# Patient Record
Sex: Male | Born: 1995 | Race: White | Hispanic: No | Marital: Single | Smoking: Never smoker
Health system: Southern US, Community
[De-identification: ages and names within clinical notes are randomized; demographics above are authoritative.]

## PROBLEM LIST (undated history)

## (undated) DIAGNOSIS — I69359 Hemiplegia and hemiparesis following cerebral infarction affecting unspecified side: Secondary | ICD-10-CM

## (undated) DIAGNOSIS — I639 Cerebral infarction, unspecified: Secondary | ICD-10-CM

## (undated) DIAGNOSIS — G709 Myoneural disorder, unspecified: Secondary | ICD-10-CM

## (undated) DIAGNOSIS — G809 Cerebral palsy, unspecified: Secondary | ICD-10-CM

## (undated) DIAGNOSIS — Q233 Congenital mitral insufficiency: Secondary | ICD-10-CM

## (undated) DIAGNOSIS — G40909 Epilepsy, unspecified, not intractable, without status epilepticus: Secondary | ICD-10-CM

## (undated) HISTORY — DX: Congenital mitral insufficiency: Q23.3

## (undated) HISTORY — DX: Hemiplegia and hemiparesis following cerebral infarction affecting unspecified side: I69.359

## (undated) HISTORY — DX: Myoneural disorder, unspecified: G70.9

## (undated) HISTORY — DX: Cerebral palsy, unspecified: G80.9

## (undated) HISTORY — DX: Epilepsy, unspecified, not intractable, without status epilepticus: G40.909

## (undated) HISTORY — DX: Cerebral infarction, unspecified: I63.9

---
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
## 2017-02-16 DIAGNOSIS — H02822 Cysts of right lower eyelid: Secondary | ICD-10-CM | POA: Insufficient documentation

## 2017-02-16 HISTORY — DX: Cysts of right lower eyelid: H02.822

## 2017-08-24 ENCOUNTER — Other Ambulatory Visit: Payer: Self-pay | Admitting: Internal Medicine

## 2017-11-19 ENCOUNTER — Other Ambulatory Visit: Payer: Self-pay | Admitting: Internal Medicine

## 2017-12-20 ENCOUNTER — Ambulatory Visit (INDEPENDENT_AMBULATORY_CARE_PROVIDER_SITE_OTHER): Payer: Medicaid Other | Admitting: Nurse Practitioner

## 2017-12-20 ENCOUNTER — Encounter: Payer: Self-pay | Admitting: Nurse Practitioner

## 2017-12-20 VITALS — BP 106/80 | HR 66 | Resp 16 | Ht 66.0 in | Wt 100.6 lb

## 2017-12-20 DIAGNOSIS — G40909 Epilepsy, unspecified, not intractable, without status epilepticus: Secondary | ICD-10-CM | POA: Diagnosis not present

## 2017-12-20 MED ORDER — CARBAMAZEPINE ER 200 MG PO CP12: 200.0000 mg | ORAL_CAPSULE | Freq: Every day | ORAL | 5 refills | Status: DC

## 2017-12-20 NOTE — Progress Notes (Signed)
[facility] [address] [place], [place] [address]  Internal MEDICINE  Office Visit Note  Patient Name: Tony Harper  [id number]  [id number]  Date of Service: 01/09/2018   Pt is here for routine follow up.   Chief Complaint  Patient presents with  . Follow-up    refill meds - carbemazepine to prevent seizures.     The patient needs to have refills for carbamazepine. Has been seizure free for the past several years. He currently has no concerns o complaints.       Current Medication: Outpatient Encounter Medications as of 12/20/2017  Medication Sig  . carbamazepine (CARBATROL) 200 MG 12 hr capsule TAKE 1 CAPSULE BY MOUTH DAILY  . carbamazepine (EQUETRO) 200 MG CP12 12 hr capsule Take 1 capsule (200 mg total) by mouth daily.   No facility-administered encounter medications on file as of 12/20/2017.     Surgical History: History reviewed. No pertinent surgical history.  Medical History: Past Medical History:  Diagnosis Date  . Cerebral palsy (HCC)   . 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 Seizure disorder [facility])     Family History: Family History  Problem Relation Age of Onset  . Hypertension Mother   . Asthma Mother   . Asthma Sister   . Hypertension Maternal Aunt   . Hypertension Maternal Grandmother   . Stroke Maternal Grandmother   . Cancer Maternal Grandfather   . Stroke Paternal Grandmother     Social History   Socioeconomic History  . Marital status: Single    Spouse name: Not on file  . Number of children: Not on file  . Years of education: Not on file  . Highest education level: Not on file  Occupational History  . Not on file  Social Needs  . Financial resource strain: Not on file  . Food insecurity:    Worry: Not on file    Inability: Not on file  . Transportation needs:    Medical: Not on file    Non-medical: Not on file  Tobacco Use  .  Smoking status: Never Smoker  . Smokeless tobacco: Never Used  Substance and Sexual Activity  . Alcohol use: Never    Frequency: Never  . Drug use: Never  . Sexual activity: Not on file  Lifestyle  . Physical activity:    Days per week: Not on file    Minutes per session: Not on file  . Stress: Not on file  Relationships  . Social connections:    Talks on phone: Not on file    Gets together: Not on file    Attends religious service: Not on file    Active member of club or organization: Not on file    Attends meetings of clubs or organizations: Not on file    Relationship status: Not on file  . Intimate partner violence:    Fear of current or ex partner: Not on file    Emotionally abused: Not on file    Physically abused: Not on file    Forced sexual activity: Not on file  Other Topics Concern  . Not on file  Social History Narrative  . Not on file      Review of Systems  Constitutional: Negative for activity change, chills, fatigue and unexpected weight change.  HENT: Negative for congestion, postnasal drip, rhinorrhea, sneezing and sore throat.   Eyes: Negative.  Negative  for redness.  Respiratory: Negative for cough, chest tightness, shortness of breath and wheezing.   Cardiovascular: Negative for chest pain and palpitations.  Gastrointestinal: Negative for abdominal pain, constipation, diarrhea, nausea and vomiting.  Endocrine: Negative for cold intolerance, heat intolerance, polydipsia, polyphagia and polyuria.  Genitourinary: Negative for dysuria, frequency, hematuria and urgency.  Musculoskeletal: Negative for arthralgias, back pain, joint swelling and neck pain.  Skin: Negative for rash.  Neurological: Positive for seizures. Negative for tremors and numbness.  Hematological: Negative for adenopathy. Does not bruise/bleed easily.  Psychiatric/Behavioral: Negative for behavioral problems (Depression), dysphoric mood, sleep disturbance and suicidal ideas. The patient  is not nervous/anxious.     Today's Vitals   12/20/17 1138  BP: 106/80  Pulse: 66  Resp: 16  SpO2: 97%  Weight: 100 lb 9.6 oz (45.6 kg)  Height:  (1.676 m)    Physical Exam  Constitutional: He is oriented to person, place, and time. He appears well-developed and well-nourished. No distress.  HENT:  Head: Normocephalic and atraumatic.  Mouth/Throat: Oropharynx is clear and moist. No oropharyngeal exudate.  Eyes: Pupils are equal, round, and reactive to light. Conjunctivae and EOM are normal.  Neck: Normal range of motion. Neck supple. No JVD present. No tracheal deviation present. No thyromegaly present.  Cardiovascular: Normal rate, regular rhythm and normal heart sounds. Exam reveals no gallop and no friction rub.  No murmur heard. Pulmonary/Chest: Effort normal and breath sounds normal. No respiratory distress. He has no wheezes. He has no rales. He exhibits no tenderness.  Abdominal: Soft. Bowel sounds are normal. There is no tenderness.  Musculoskeletal: Normal range of motion.  Lymphadenopathy:    He has no cervical adenopathy.  Neurological: He is alert and oriented to person, place, and time. He displays normal reflexes. No cranial nerve deficit.  Skin: Skin is warm and dry. He is not diaphoretic.  Psychiatric: He has a normal mood and affect. His behavior is normal. Judgment and thought content normal.  Nursing note and vitals reviewed.  Assessment/Plan:   1. Seizure disorder (HCC) Seizure free for past several years. Continue carbamazepine as prescribed. Refills provided today.  - carbamazepine (EQUETRO) 200 MG CP12 12 hr capsule; Take 1 capsule (200 mg total) by mouth daily.  Dispense: 30 each; Refill: 5  General Counseling: Tony Harper verbalizes understanding of the findings of todays visit and agrees with plan of treatment. I have discussed any further diagnostic evaluation that may be needed or ordered today. We also reviewed his medications today. he has been  encouraged to call the office with any questions or concerns that should arise related to todays visit.   This patient was seen by Vincent Gros, FNP- C in Collaboration with Dr Lyndon Code as a part of collaborative care agreement  Meds ordered this encounter  Medications  . carbamazepine (EQUETRO) 200 MG CP12 12 hr capsule    Sig: Take 1 capsule (200 mg total) by mouth daily.    Dispense:  30 each    Refill:  5    Order Specific Question:   Supervising Provider    Answer:   Lyndon Code [[id number]]    Time spent: 45 Minutes      Dr Lyndon Code Internal medicine

## 2018-01-09 ENCOUNTER — Encounter: Payer: Self-pay | Admitting: Nurse Practitioner

## 2018-01-09 DIAGNOSIS — G40909 Epilepsy, unspecified, not intractable, without status epilepticus: Secondary | ICD-10-CM | POA: Insufficient documentation

## 2018-02-16 ENCOUNTER — Other Ambulatory Visit: Payer: Self-pay

## 2018-02-16 DIAGNOSIS — G40909 Epilepsy, unspecified, not intractable, without status epilepticus: Secondary | ICD-10-CM

## 2018-02-16 MED ORDER — CARBAMAZEPINE ER 200 MG PO CP12: 200.0000 mg | ORAL_CAPSULE | Freq: Every day | ORAL | 2 refills | Status: DC

## 2018-02-21 ENCOUNTER — Other Ambulatory Visit: Payer: Self-pay | Admitting: Nurse Practitioner

## 2018-02-21 DIAGNOSIS — G40909 Epilepsy, unspecified, not intractable, without status epilepticus: Secondary | ICD-10-CM

## 2018-02-21 MED ORDER — CARBAMAZEPINE ER 200 MG PO CP12: 200.0000 mg | ORAL_CAPSULE | Freq: Every day | ORAL | 2 refills | Status: DC

## 2018-03-10 ENCOUNTER — Other Ambulatory Visit: Payer: Self-pay

## 2018-03-10 DIAGNOSIS — G40909 Epilepsy, unspecified, not intractable, without status epilepticus: Secondary | ICD-10-CM

## 2018-03-10 MED ORDER — CARBAMAZEPINE ER 200 MG PO CP12: 200.0000 mg | ORAL_CAPSULE | Freq: Every day | ORAL | 1 refills | Status: DC

## 2018-06-02 ENCOUNTER — Other Ambulatory Visit: Payer: Self-pay | Admitting: Nurse Practitioner

## 2018-06-02 DIAGNOSIS — G40909 Epilepsy, unspecified, not intractable, without status epilepticus: Secondary | ICD-10-CM

## 2018-06-02 MED ORDER — CARBAMAZEPINE ER 200 MG PO CP12: 200.0000 mg | ORAL_CAPSULE | Freq: Every day | ORAL | 0 refills | Status: DC

## 2018-06-02 NOTE — Progress Notes (Signed)
Refilled carbamazepine 200mg  QD per pharmacy request. Patient has follow up appointment 10/31

## 2018-06-03 ENCOUNTER — Other Ambulatory Visit: Payer: Self-pay

## 2018-06-08 ENCOUNTER — Other Ambulatory Visit: Payer: Self-pay | Admitting: Nurse Practitioner

## 2018-06-08 DIAGNOSIS — G40909 Epilepsy, unspecified, not intractable, without status epilepticus: Secondary | ICD-10-CM

## 2018-06-08 MED ORDER — CARBAMAZEPINE ER 200 MG PO CP12: 200.0000 mg | ORAL_CAPSULE | Freq: Every day | ORAL | 0 refills | Status: DC

## 2018-06-09 ENCOUNTER — Other Ambulatory Visit: Payer: Self-pay

## 2018-06-09 DIAGNOSIS — G40909 Epilepsy, unspecified, not intractable, without status epilepticus: Secondary | ICD-10-CM

## 2018-06-09 MED ORDER — CARBAMAZEPINE ER 200 MG PO CP12: 200.0000 mg | ORAL_CAPSULE | Freq: Every day | ORAL | 1 refills | Status: DC

## 2018-06-09 NOTE — Telephone Encounter (Signed)
Spoke with pharmacist to ensure the rx for pt went through electronically. Pharmacist stated that it had been rejected yesterday, I gave verbal approval for refill per heather.

## 2018-06-23 ENCOUNTER — Encounter: Payer: Self-pay | Admitting: Nurse Practitioner

## 2018-06-23 ENCOUNTER — Ambulatory Visit: Payer: Medicaid Other | Admitting: Nurse Practitioner

## 2018-06-23 VITALS — BP 122/86 | HR 68 | Resp 16 | Ht 66.0 in | Wt 102.4 lb

## 2018-06-23 DIAGNOSIS — G809 Cerebral palsy, unspecified: Secondary | ICD-10-CM | POA: Diagnosis not present

## 2018-06-23 DIAGNOSIS — Q233 Congenital mitral insufficiency: Secondary | ICD-10-CM | POA: Insufficient documentation

## 2018-06-23 DIAGNOSIS — G40909 Epilepsy, unspecified, not intractable, without status epilepticus: Secondary | ICD-10-CM | POA: Diagnosis not present

## 2018-06-23 NOTE — Progress Notes (Signed)
[facility] [address] [place], [place] [address]  Internal MEDICINE  Office Visit Note  Patient Name: Tony Harper  [id number]  [id number]  Date of Service: 06/23/2018  Chief Complaint  Patient presents with  . Medical Management of Chronic Issues    6 month follow up    The patient does have chronic seizure disorder. Takes Carbamazepine for this. Has been seizure free for the past several years. He needs to have refills for this today. He currently has no concerns o complaints.       Current Medication: Outpatient Encounter Medications as of 06/23/2018  Medication Sig  . carbamazepine (EQUETRO) 200 MG CP12 12 hr capsule Take 1 capsule (200 mg total) by mouth daily.   No facility-administered encounter medications on file as of 06/23/2018.     Surgical History: History reviewed. No pertinent surgical history.  Medical History: Past Medical History:  Diagnosis Date  . Cerebral palsy (HCC)   . 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 Seizure disorder [facility])     Family History: Family History  Problem Relation Age of Onset  . Hypertension Mother   . Asthma Mother   . Asthma Sister   . Hypertension Maternal Aunt   . Hypertension Maternal Grandmother   . Stroke Maternal Grandmother   . Cancer Maternal Grandfather   . Stroke Paternal Grandmother     Social History   Socioeconomic History  . Marital status: Single    Spouse name: Not on file  . Number of children: Not on file  . Years of education: Not on file  . Highest education level: Not on file  Occupational History  . Not on file  Social Needs  . Financial resource strain: Not on file  . Food insecurity:    Worry: Not on file    Inability: Not on file  . Transportation needs:    Medical: Not on file    Non-medical: Not on file  Tobacco Use  . Smoking status: Never Smoker  . Smokeless tobacco: Never  Used  Substance and Sexual Activity  . Alcohol use: Never    Frequency: Never  . Drug use: Never  . Sexual activity: Not on file  Lifestyle  . Physical activity:    Days per week: Not on file    Minutes per session: Not on file  . Stress: Not on file  Relationships  . Social connections:    Talks on phone: Not on file    Gets together: Not on file    Attends religious service: Not on file    Active member of club or organization: Not on file    Attends meetings of clubs or organizations: Not on file    Relationship status: Not on file  . Intimate partner violence:    Fear of current or ex partner: Not on file    Emotionally abused: Not on file    Physically abused: Not on file    Forced sexual activity: Not on file  Other Topics Concern  . Not on file  Social History Narrative  . Not on file      Review of Systems  Constitutional: Negative for activity change, chills, fatigue and unexpected weight change.  HENT: Negative for congestion, postnasal drip, rhinorrhea, sneezing and sore throat.   Eyes: Negative.  Negative for redness.  Respiratory: Negative for cough, chest tightness, shortness of breath and wheezing.  Cardiovascular: Negative for chest pain and palpitations.  Gastrointestinal: Negative for abdominal pain, constipation, diarrhea, nausea and vomiting.  Endocrine: Negative for cold intolerance, heat intolerance, polydipsia, polyphagia and polyuria.  Allergic/Immunologic: Negative for environmental allergies.  Neurological: Positive for seizures. Negative for tremors and numbness.  Hematological: Negative for adenopathy. Does not bruise/bleed easily.  Psychiatric/Behavioral: Negative for behavioral problems (Depression), dysphoric mood, sleep disturbance and suicidal ideas. The patient is not nervous/anxious.     Vital Signs: BP 122/86 (BP Location: Right Arm, Patient Position: Sitting, Cuff Size: Normal)   Pulse 68   Resp 16   Ht 5\' 6"  (1.676 m)   Wt 102  lb 6.4 oz (46.4 kg)   SpO2 99%   BMI 16.53 kg/m    Physical Exam  Constitutional: He is oriented to person, place, and time. He appears well-developed and well-nourished. No distress.  HENT:  Head: Normocephalic and atraumatic.  Mouth/Throat: Oropharynx is clear and moist. No oropharyngeal exudate.  Eyes: Pupils are equal, round, and reactive to light. Conjunctivae and EOM are normal.  Neck: Normal range of motion. Neck supple. No JVD present. No tracheal deviation present. No thyromegaly present.  Cardiovascular: Normal rate, regular rhythm and normal heart sounds. Exam reveals no gallop and no friction rub.  No murmur heard. Pulmonary/Chest: Effort normal and breath sounds normal. No respiratory distress. He has no wheezes. He has no rales. He exhibits no tenderness.  Abdominal: Soft. Bowel sounds are normal. There is no tenderness.  Lymphadenopathy:    He has no cervical adenopathy.  Neurological: He is alert and oriented to person, place, and time. He displays normal reflexes. No cranial nerve deficit.  The patient is at neurological baseline  Skin: Skin is warm and dry. He is not diaphoretic.  Psychiatric: He has a normal mood and affect. His behavior is normal. Judgment and thought content normal.  Nursing note and vitals reviewed.  Assessment/Plan:  1. Seizure disorder (HCC) Continue carbamazepine as prescribed. Refills to be provided as needed   2. Cerebral palsy, unspecified type (HCC) Stable. Continue to monitor.    General Counseling: angelus hoopes understanding of the findings of todays visit and agrees with plan of treatment. I have discussed any further diagnostic evaluation that may be needed or ordered today. We also reviewed his medications today. he has been encouraged to call the office with any questions or concerns that should arise related to todays visit.  This patient was seen by Vincent Gros FNP Collaboration with Dr Lyndon Code as a part of  collaborative care agreement  Time spent: 15 Minutes      Dr Lyndon Code Internal medicine

## 2018-07-22 ENCOUNTER — Emergency Department: Payer: Medicaid Other

## 2018-07-22 ENCOUNTER — Other Ambulatory Visit: Payer: Self-pay

## 2018-07-22 ENCOUNTER — Inpatient Hospital Stay
Admission: EM | Admit: 2018-07-22 | Discharge: 2018-07-24 | DRG: 871 | Disposition: A | Discharge status: Home / Self Care | Payer: Medicaid Other | Attending: Internal Medicine | Admitting: Internal Medicine

## 2018-07-22 ENCOUNTER — Encounter: Payer: Self-pay | Admitting: Emergency Medicine

## 2018-07-22 DIAGNOSIS — Z825 Family history of asthma and other chronic lower respiratory diseases: Secondary | ICD-10-CM

## 2018-07-22 DIAGNOSIS — Z8249 Family history of ischemic heart disease and other diseases of the circulatory system: Secondary | ICD-10-CM

## 2018-07-22 DIAGNOSIS — J111 Influenza due to unidentified influenza virus with other respiratory manifestations: Secondary | ICD-10-CM

## 2018-07-22 DIAGNOSIS — Z820 Family history of epilepsy and other diseases of the nervous system: Secondary | ICD-10-CM

## 2018-07-22 DIAGNOSIS — I69351 Hemiplegia and hemiparesis following cerebral infarction affecting right dominant side: Secondary | ICD-10-CM

## 2018-07-22 DIAGNOSIS — I959 Hypotension, unspecified: Secondary | ICD-10-CM

## 2018-07-22 DIAGNOSIS — A419 Sepsis, unspecified organism: Principal | ICD-10-CM

## 2018-07-22 DIAGNOSIS — R569 Unspecified convulsions: Secondary | ICD-10-CM

## 2018-07-22 DIAGNOSIS — G9341 Metabolic encephalopathy: Secondary | ICD-10-CM | POA: Diagnosis present

## 2018-07-22 DIAGNOSIS — Z823 Family history of stroke: Secondary | ICD-10-CM

## 2018-07-22 DIAGNOSIS — Z809 Family history of malignant neoplasm, unspecified: Secondary | ICD-10-CM

## 2018-07-22 DIAGNOSIS — J690 Pneumonitis due to inhalation of food and vomit: Secondary | ICD-10-CM | POA: Diagnosis present

## 2018-07-22 DIAGNOSIS — I340 Nonrheumatic mitral (valve) insufficiency: Secondary | ICD-10-CM | POA: Diagnosis present

## 2018-07-22 DIAGNOSIS — Z79899 Other long term (current) drug therapy: Secondary | ICD-10-CM

## 2018-07-22 DIAGNOSIS — G9389 Other specified disorders of brain: Secondary | ICD-10-CM | POA: Diagnosis present

## 2018-07-22 DIAGNOSIS — G40909 Epilepsy, unspecified, not intractable, without status epilepticus: Secondary | ICD-10-CM | POA: Diagnosis present

## 2018-07-22 DIAGNOSIS — R1011 Right upper quadrant pain: Secondary | ICD-10-CM

## 2018-07-22 DIAGNOSIS — J45998 Other asthma: Secondary | ICD-10-CM | POA: Diagnosis present

## 2018-07-22 DIAGNOSIS — K828 Other specified diseases of gallbladder: Secondary | ICD-10-CM | POA: Diagnosis present

## 2018-07-22 DIAGNOSIS — J101 Influenza due to other identified influenza virus with other respiratory manifestations: Secondary | ICD-10-CM | POA: Diagnosis present

## 2018-07-22 DIAGNOSIS — R4182 Altered mental status, unspecified: Secondary | ICD-10-CM

## 2018-07-22 DIAGNOSIS — G809 Cerebral palsy, unspecified: Secondary | ICD-10-CM | POA: Diagnosis present

## 2018-07-22 DIAGNOSIS — E860 Dehydration: Secondary | ICD-10-CM | POA: Diagnosis present

## 2018-07-22 LAB — COMPREHENSIVE METABOLIC PANEL
ALT: 12 U/L (ref 0–44)
ANION GAP: 12 (ref 5–15)
AST: 28 U/L (ref 15–41)
Albumin: 4.2 g/dL (ref 3.5–5.0)
Alkaline Phosphatase: 113 U/L (ref 38–126)
BUN: 12 mg/dL (ref 6–20)
CO2: 19 mmol/L — ABNORMAL LOW (ref 22–32)
Calcium: 8.4 mg/dL — ABNORMAL LOW (ref 8.9–10.3)
Chloride: 105 mmol/L (ref 98–111)
Creatinine, Ser: 1.02 mg/dL (ref 0.61–1.24)
GFR calc Af Amer: >60 mL/min (ref >60)
GFR calc non Af Amer: >60 mL/min (ref >60)
GLUCOSE: 146 mg/dL — AB (ref 70–99)
Potassium: 3.6 mmol/L (ref 3.5–5.1)
Sodium: 136 mmol/L (ref 135–145)
Total Bilirubin: 0.6 mg/dL (ref 0.3–1.2)
Total Protein: 6.8 g/dL (ref 6.5–8.1)

## 2018-07-22 LAB — CBC WITH DIFFERENTIAL/PLATELET
ABS IMMATURE GRANULOCYTES: 0.02 10*3/uL (ref 0.00–0.07)
Basophils Absolute: 0.1 10*3/uL (ref 0.0–0.1)
Basophils Relative: 1 %
Eosinophils Absolute: 0 10*3/uL (ref 0.0–0.5)
Eosinophils Relative: 0 %
HCT: 43.8 % (ref 39.0–52.0)
Hemoglobin: 15.2 g/dL (ref 13.0–17.0)
Immature Granulocytes: 0 %
Lymphocytes Relative: 8 %
Lymphs Abs: 0.5 10*3/uL — ABNORMAL LOW (ref 0.7–4.0)
MCH: 29.7 pg (ref 26.0–34.0)
MCHC: 34.7 g/dL (ref 30.0–36.0)
MCV: 85.7 fL (ref 80.0–100.0)
MONO ABS: 0.6 10*3/uL (ref 0.1–1.0)
MONOS PCT: 10 %
NEUTROS ABS: 4.8 10*3/uL (ref 1.7–7.7)
NEUTROS PCT: 81 %
Platelets: 149 10*3/uL — ABNORMAL LOW (ref 150–400)
RBC: 5.11 MIL/uL (ref 4.22–5.81)
RDW: 11.7 % (ref 11.5–15.5)
WBC: 6 10*3/uL (ref 4.0–10.5)
nRBC: 0 % (ref 0.0–0.2)

## 2018-07-22 LAB — INFLUENZA PANEL BY PCR (TYPE A & B)
Influenza A By PCR: POSITIVE — AB
Influenza B By PCR: NEGATIVE

## 2018-07-22 LAB — CARBAMAZEPINE LEVEL, TOTAL: Carbamazepine Lvl: 2.4 ug/mL — ABNORMAL LOW (ref 4.0–12.0)

## 2018-07-22 LAB — PHENYTOIN LEVEL, TOTAL: Phenytoin Lvl: <2.5 ug/mL — ABNORMAL LOW (ref 10.0–20.0)

## 2018-07-22 MED ORDER — LORAZEPAM 2 MG/ML IJ SOLN
1.0000 mg | Freq: Once | INTRAMUSCULAR | Status: AC
  Administered 2018-07-22: 1 mg via INTRAVENOUS

## 2018-07-22 MED ORDER — FLUMAZENIL 0.5 MG/5ML IV SOLN
INTRAVENOUS | Status: AC
  Filled 2018-07-22: qty 5

## 2018-07-22 MED ORDER — IOPAMIDOL (ISOVUE-300) INJECTION 61%
100.0000 mL | Freq: Once | INTRAVENOUS | Status: AC | PRN
  Administered 2018-07-22: 100 mL via INTRAVENOUS

## 2018-07-22 MED ORDER — LORAZEPAM 2 MG/ML IJ SOLN
INTRAMUSCULAR | Status: AC
  Filled 2018-07-22: qty 1

## 2018-07-22 MED ORDER — LORAZEPAM 2 MG/ML IJ SOLN
INTRAMUSCULAR | Status: AC
  Administered 2018-07-22: 2 mg
  Filled 2018-07-22: qty 1

## 2018-07-22 MED ORDER — SODIUM CHLORIDE 0.9 % IV SOLN
1.0000 g | Freq: Once | INTRAVENOUS | Status: AC
  Administered 2018-07-22: 1 g via INTRAVENOUS
  Filled 2018-07-22: qty 1

## 2018-07-22 NOTE — ED Notes (Signed)
Pt yellow ring with yellow stone that says [employer] and black watch given to patient's mother.

## 2018-07-22 NOTE — ED Provider Notes (Addendum)
[facility] Emergency Department Provider Note   ____________________________________________   First MD Initiated Contact with Patient 07/22/18 2209     (approximate)  I have reviewed the triage vital signs and the nursing notes.   HISTORY  Chief Complaint Seizures  History obtained from mom as patient is postictal  HPI Tony Harper is a 22 y.o. male patient has mild CP and mild MR had a stroke when he was very young but is normally walkie-talkie and normal.  He had a seizure disorder last seizure was 5 years ago.  He woke up this morning complaining of pain on the right side of his body just at the end of the ribs.  It was possibly worse with deep breathing.  Pain was moderate.  Afternoon he suddenly spiked a fever to 103 and then this evening had a seizure.  It was again his first for 5 years.  EMS arrived and he was still seizing they gave him 2 of Versed and the seizure stopped.  On arrival here patient was confused and combative and made it difficult to maintain his IV.  He was given some doses of Ativan and became more compliant.  We are able to CT him.  Patient does have a cough in the emergency room he did not have a cough during the day.  His carbamazepine dose has not been adjusted for many years.   Past Medical History:  Diagnosis Date  . Cerebral palsy (HCC)   . 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 Seizure disorder [facility])     Patient Active Problem List   Diagnosis Date Noted  . 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 Cerebral palsy (HCC) 06/23/2018  . Seizure disorder (HCC) 01/09/2018    History reviewed. No pertinent surgical history.  Prior to Admission medications   Medication Sig Start Date End Date Taking? Authorizing Provider  carbamazepine (CARBATROL) 200 MG 12 hr capsule Take 200 mg by mouth at bedtime. 06/11/18  Yes [provider]  carbamazepine (EQUETRO) 200 MG CP12 12 hr capsule Take 1 capsule (200 mg total) by mouth daily. Patient not taking: Reported on 07/23/2018 06/09/18   Carlean Jews, NP    Allergies Patient has no known allergies.  Family History  Problem Relation Age of Onset  . Hypertension Mother   . Asthma Mother   . Asthma Sister   . Hypertension Maternal Aunt   . Hypertension Maternal Grandmother   . Stroke Maternal Grandmother   . Cancer Maternal Grandfather   . Stroke Paternal Grandmother     Social History Social History   Tobacco Use  . Smoking status: Never Smoker  . Smokeless tobacco: Never Used  Substance Use Topics  . Alcohol use: Never    Frequency: Never  . Drug use: Never    Review of Systems Unable to obtain  ____________________________________________   PHYSICAL EXAM:  VITAL SIGNS: ED Triage Vitals  Enc Vitals Group     BP --      Pulse --      Resp --      Temp 07/22/18 2213 (!) 101.2 F (38.4 C)     Temp Source 07/22/18 2213 Rectal     SpO2 07/22/18 2211 99 %     Weight 07/22/18 2214 102 lb 4.7 oz (46.4 kg)     Height 07/22/18 2214 5\' 10"  (1.778 m)  Head Circumference --      Peak Flow --      Pain Score --      Pain Loc --      Pain Edu? --      Excl. in GC? --    Constitutional: Confused and combative due to postictal state Eyes: Conjunctivae are normal. PER. EOMI. Head: Atraumatic. Nose: No congestion/rhinnorhea. Mouth/Throat: Mucous membranes are moist.  Oropharynx non-erythematous. Neck: No stridor.  Cardiovascular: Rapid rate, regular rhythm. Grossly normal heart sounds.  Good peripheral circulation. Respiratory: Normal respiratory effort.  No retractions. Lungs CTAB. Gastrointestinal: Soft and nontender. No distention. No abdominal bruits. No CVA tenderness. Musculoskeletal: No lower extremity tenderness nor edema.  No joint effusions. Neurologic:  Normal speech and language. No gross focal neurologic deficits are  appreciated. No gait instability. Skin:  Skin is warm, dry and intact. No rash noted. Psychiatric: Mood and affect are normal. Speech and behavior are normal.  ____________________________________________   LABS (all labs ordered are listed, but only abnormal results are displayed)  Labs Reviewed  COMPREHENSIVE METABOLIC PANEL - Abnormal; Notable for the following components:      Result Value   CO2 19 (*)    Glucose, Bld 146 (*)    Calcium 8.4 (*)    All other components within normal limits  CBC WITH DIFFERENTIAL/PLATELET - Abnormal; Notable for the following components:   Platelets 149 (*)    Lymphs Abs 0.5 (*)    All other components within normal limits  PHENYTOIN LEVEL, TOTAL - Abnormal; Notable for the following components:   Phenytoin Lvl <2.5 (*)    All other components within normal limits  CARBAMAZEPINE LEVEL, TOTAL - Abnormal; Notable for the following components:   Carbamazepine Lvl 2.4 (*)    All other components within normal limits  INFLUENZA PANEL BY PCR (TYPE A & B) - Abnormal; Notable for the following components:   Influenza A By PCR POSITIVE (*)    All other components within normal limits  LACTIC ACID, PLASMA - Abnormal; Notable for the following components:   Lactic Acid, Venous 4.3 (*)    All other components within normal limits  CULTURE, BLOOD (ROUTINE X 2)  CULTURE, BLOOD (ROUTINE X 2)  URINALYSIS, ROUTINE W REFLEX MICROSCOPIC  LACTIC ACID, PLASMA  CSF CELL COUNT WITH DIFFERENTIAL  GLUCOSE, CSF  PROTEIN, CSF   ____________________________________________  EKG  EKG read and interpreted by me shows sinus tachycardia rate of 122 normal axis no acute ST-T wave changes ____________________________________________  RADIOLOGY  ED MD interpretation: CT of the head shows no acute changes I reviewed the films CT of the chest shows some tree-in-bud changes in the right lower lobe I reviewed those films as well CT of the belly shows a somewhat enlarged  gallbladder I reviewed those films as well  Official radiology report(s): Ct Head Wo Contrast  Result Date: 07/22/2018 CLINICAL DATA:  First seizure in 5 years. Altered level of consciousness, unexplained. Fever. EXAM: CT HEAD WITHOUT CONTRAST TECHNIQUE: Contiguous axial images were obtained from the base of the skull through the vertex without intravenous contrast. COMPARISON:  None. FINDINGS: Brain: No acute hemorrhage. Left parietal encephalomalacia with associated ex vacuo dilatation of the left lateral ventricle. No evidence of acute ischemia. No midline shift or mass effect. No hydrocephalus. The basilar cisterns are patent. No subdural collection. Vascular: No hyperdense vessel or unexpected calcification. Skull: No fracture or focal lesion. Sinuses/Orbits: Paranasal sinuses and mastoid air cells are clear. Small mucous retention cyst  in the right maxillary sinus. The visualized orbits are unremarkable. Other: None. IMPRESSION: 1. No acute intracranial abnormality. 2. Remote left parietal infarct with mild ex vacuo dilatation of the left lateral ventricle. Electronically Signed   By: Narda Rutherford M.D.   On: 07/22/2018 23:15   Ct Chest W Contrast  Result Date: 07/22/2018 CLINICAL DATA:  Right upper quadrant pain. Sudden onset of fever. Seizure. EXAM: CT CHEST, ABDOMEN, AND PELVIS WITH CONTRAST TECHNIQUE: Multidetector CT imaging of the chest, abdomen and pelvis was performed following the standard protocol during bolus administration of intravenous contrast. CONTRAST:  ISOVUE-300 IOPAMIDOL (ISOVUE-300) INJECTION 61% COMPARISON:  None. FINDINGS: CT CHEST FINDINGS Cardiovascular: No significant vascular findings. Normal heart size. No pericardial effusion. Mediastinum/Nodes: Triangular soft tissue density in the anterior mediastinum consistent with residual or recurrent thymus. Upper esophagus mildly patulous without wall thickening. No adenopathy. Lungs/Pleura: Tree in bud opacities in the  right lower lobe with right lower lobe bronchial thickening. No confluent airspace disease. The left lung is clear. Mild motion artifact through the basis. No pulmonary edema or pleural fluid. Musculoskeletal: There are no acute or suspicious osseous abnormalities. CT ABDOMEN PELVIS FINDINGS Hepatobiliary: Motion artifact through the upper abdomen. Mild gallbladder distention without calcified gallstone. No biliary dilatation. No focal hepatic abnormality. Pancreas: Unremarkable allowing for motion artifact. Spleen: Normal in size without focal abnormality. Adrenals/Urinary Tract: Adrenal glands are unremarkable. Kidneys are normal, without renal calculi, focal lesion, or hydronephrosis. Bladder is nondistended but otherwise negative. Stomach/Bowel: Bowel evaluation is limited in the absence of enteric contrast, paucity of intra-abdominal fat, and motion artifact. No evidence of obstruction or bowel inflammation. Normal appendix tentatively identified, for example image 48 series 5. Regardless, no pericecal inflammation to suggest appendicitis. No colonic wall thickening or inflammatory change. Vascular/Lymphatic: No significant vascular findings are present. No enlarged abdominal or pelvic lymph nodes. Reproductive: Prostate is unremarkable. Other: No free air or free fluid. Musculoskeletal: Mild scoliotic curvature of the lower lumbar spine. There are no acute or suspicious osseous abnormalities. IMPRESSION: 1. Tree in bud opacities in the right lower lobe with right lower lobe bronchial thickening. Findings may represent infectious bronchiolitis or aspiration. 2. Motion artifact through the gallbladder with mild gallbladder distention. If there is clinical concern for hepatobiliary pathology, recommend sonographic evaluation. Electronically Signed   By: Narda Rutherford M.D.   On: 07/22/2018 23:24   Ct Abdomen Pelvis W Contrast  Result Date: 07/22/2018 CLINICAL DATA:  Right upper quadrant pain. Sudden onset  of fever. Seizure. EXAM: CT CHEST, ABDOMEN, AND PELVIS WITH CONTRAST TECHNIQUE: Multidetector CT imaging of the chest, abdomen and pelvis was performed following the standard protocol during bolus administration of intravenous contrast. CONTRAST:  ISOVUE-300 IOPAMIDOL (ISOVUE-300) INJECTION 61% COMPARISON:  None. FINDINGS: CT CHEST FINDINGS Cardiovascular: No significant vascular findings. Normal heart size. No pericardial effusion. Mediastinum/Nodes: Triangular soft tissue density in the anterior mediastinum consistent with residual or recurrent thymus. Upper esophagus mildly patulous without wall thickening. No adenopathy. Lungs/Pleura: Tree in bud opacities in the right lower lobe with right lower lobe bronchial thickening. No confluent airspace disease. The left lung is clear. Mild motion artifact through the basis. No pulmonary edema or pleural fluid. Musculoskeletal: There are no acute or suspicious osseous abnormalities. CT ABDOMEN PELVIS FINDINGS Hepatobiliary: Motion artifact through the upper abdomen. Mild gallbladder distention without calcified gallstone. No biliary dilatation. No focal hepatic abnormality. Pancreas: Unremarkable allowing for motion artifact. Spleen: Normal in size without focal abnormality. Adrenals/Urinary Tract: Adrenal glands are unremarkable. Kidneys are  normal, without renal calculi, focal lesion, or hydronephrosis. Bladder is nondistended but otherwise negative. Stomach/Bowel: Bowel evaluation is limited in the absence of enteric contrast, paucity of intra-abdominal fat, and motion artifact. No evidence of obstruction or bowel inflammation. Normal appendix tentatively identified, for example image 48 series 5. Regardless, no pericecal inflammation to suggest appendicitis. No colonic wall thickening or inflammatory change. Vascular/Lymphatic: No significant vascular findings are present. No enlarged abdominal or pelvic lymph nodes. Reproductive: Prostate is unremarkable.  Other: No free air or free fluid. Musculoskeletal: Mild scoliotic curvature of the lower lumbar spine. There are no acute or suspicious osseous abnormalities. IMPRESSION: 1. Tree in bud opacities in the right lower lobe with right lower lobe bronchial thickening. Findings may represent infectious bronchiolitis or aspiration. 2. Motion artifact through the gallbladder with mild gallbladder distention. If there is clinical concern for hepatobiliary pathology, recommend sonographic evaluation. Electronically Signed   By: Narda Rutherford M.D.   On: 07/22/2018 23:24   US Abdomen Limited Ruq  Result Date: 07/23/2018 CLINICAL DATA:  Right upper quadrant pain. EXAM: ULTRASOUND ABDOMEN LIMITED RIGHT UPPER QUADRANT COMPARISON:  Chest, abdomen and pelvis CT, 07/22/2018 FINDINGS: Gallbladder: No gallstones or wall thickening visualized. No sonographic Murphy sign noted by sonographer. Common bile duct: Diameter: 2 mm Liver: No focal lesion identified. Within normal limits in parenchymal echogenicity. Portal vein is patent on color Doppler imaging with normal direction of blood flow towards the liver. IMPRESSION: Normal right upper quadrant ultrasound. No evidence of acute cholecystitis. No gallstones. Electronically Signed   By: Amie Portland M.D.   On: 07/23/2018 00:40    ____________________________________________   PROCEDURES  Procedure(s) performed: Consent obtained from mom patient sedated further with ketamine gave him half a milligram per kilo 3 times because he continued to move back cleaned with Betadine and prepped in usual sterile fashion lidocaine injected in the 4th-5th intervertebral space needle inserted opening pressure 14-1/2 cm of water CSF was clear colorless patient tolerated very well woke up almost immediately after the needle was removed CSF sent to lab  Procedures  Critical Care performed: Critical care time 1 hour and 20 minutes this includes evaluating the patient, treating with  maxipine and fluids, speaking with mom being at the bedside  while we try to wake the patient up then going to CAT scan with him sedating himthe initially and again later with the ketamine later and performing the spinal tap  ____________________________________________   INITIAL IMPRESSION / ASSESSMENT AND PLAN / ED COURSE In ct pt would not allow nasal canula O2 on.  While in ct scanner pt began to desat in the middle of the scan. He rapidly dropped to 68% briefly (less than 1 minute by my count) when stimulation and O2 brought him back up   Patient much more awake but still not himself after reasonable amount of time. I hand not done  spinal tap because the patient had not complained of any headache and had been in his normal mental status until he had a seizure and his neck was supple in the ER however after discussing with mom we did the spinal tap (see note above) we will get the patient admitted he does have the flu we also has a white count with a high neutrophil count his blood pressure still somewhat low after 2 L of fluid        ____________________________________________   FINAL CLINICAL IMPRESSION(S) / ED DIAGNOSES  Final diagnoses:  Seizure (HCC)  Sepsis associated hypotension (HCC)  Altered mental status, unspecified altered mental status type  Flu     ED Discharge Orders    None       Note:  This document was prepared using Dragon voice recognition software and may include unintentional dictation errors.    Arnaldo NatalMalinda, Roselie Cirigliano F, MD 07/23/18 0130    Arnaldo NatalMalinda, Jonathin Heinicke F, MD 07/23/18 [id number]    Arnaldo NatalMalinda, Nicholson Starace F, MD 07/23/18 [phone]

## 2018-07-22 NOTE — ED Notes (Signed)
Pt taken to CT by this RN, Pattricia BossAnnie, RN, and Dr. Darnelle CatalanMalinda. Pt noted to desat to 68% on RA while on CT scanner, this RN placed patient on 4L via [place], pt immediately up to 98% on 4L via [place]. Scans completed, pt escorted back to room by this RN and Dr. Darnelle CatalanMalinda and Pattricia BossAnnie, RN.

## 2018-07-22 NOTE — ED Triage Notes (Signed)
pt presents from home via [facility] with c/o active seizure. Pt family states that pt Hasnt had a seizure in 5 years. Family stated at 130pm pt spiked fever of 103 and c/o right upper quadrant pain. Blood sugar 102. 18 G in right forearm. Pt has cerebral palsy and MR. Pt takes tegretol for seizures. Oxygen saturation in low 80s for EMS. 97% on 15L for EMS. Pt was given versed in route. HR elevated at 133

## 2018-07-22 NOTE — ED Notes (Signed)
Pt becoming more agitated in bed. Dr. Juliette AlcideMelinda at bedside. Dr. Juliette AlcideMelinda at bedside and placed verbal order of 1mg  ativan IV. 1mg  of Ativan given by Aundra MilletMegan, RN at this time.

## 2018-07-22 NOTE — Progress Notes (Signed)
CODE SEPSIS - PHARMACY COMMUNICATION  **Broad Spectrum Antibiotics should be administered within 1 hour of Sepsis diagnosis**  Time Code Sepsis Called/Page Received: 1129 2222  Antibiotics Ordered: 1129 2221  Time of 1st antibiotic administration: 1129 2244  Additional action taken by pharmacy:   If necessary, Name of Provider/Nurse Contacted:     Erich MontaneMcBane,Rodneshia Greenhouse S ,PharmD Clinical Pharmacist  07/22/2018  11:18 PM

## 2018-07-23 ENCOUNTER — Other Ambulatory Visit: Payer: Self-pay

## 2018-07-23 DIAGNOSIS — G809 Cerebral palsy, unspecified: Secondary | ICD-10-CM | POA: Diagnosis present

## 2018-07-23 DIAGNOSIS — Z823 Family history of stroke: Secondary | ICD-10-CM | POA: Diagnosis not present

## 2018-07-23 DIAGNOSIS — Z809 Family history of malignant neoplasm, unspecified: Secondary | ICD-10-CM | POA: Diagnosis not present

## 2018-07-23 DIAGNOSIS — G40909 Epilepsy, unspecified, not intractable, without status epilepticus: Secondary | ICD-10-CM | POA: Diagnosis present

## 2018-07-23 DIAGNOSIS — G9389 Other specified disorders of brain: Secondary | ICD-10-CM | POA: Diagnosis present

## 2018-07-23 DIAGNOSIS — R569 Unspecified convulsions: Secondary | ICD-10-CM

## 2018-07-23 DIAGNOSIS — A419 Sepsis, unspecified organism: Secondary | ICD-10-CM | POA: Diagnosis present

## 2018-07-23 DIAGNOSIS — I69351 Hemiplegia and hemiparesis following cerebral infarction affecting right dominant side: Secondary | ICD-10-CM | POA: Diagnosis not present

## 2018-07-23 DIAGNOSIS — Z820 Family history of epilepsy and other diseases of the nervous system: Secondary | ICD-10-CM | POA: Diagnosis not present

## 2018-07-23 DIAGNOSIS — J111 Influenza due to unidentified influenza virus with other respiratory manifestations: Secondary | ICD-10-CM | POA: Diagnosis present

## 2018-07-23 DIAGNOSIS — Z8249 Family history of ischemic heart disease and other diseases of the circulatory system: Secondary | ICD-10-CM | POA: Diagnosis not present

## 2018-07-23 DIAGNOSIS — E860 Dehydration: Secondary | ICD-10-CM | POA: Diagnosis present

## 2018-07-23 DIAGNOSIS — J45998 Other asthma: Secondary | ICD-10-CM | POA: Diagnosis present

## 2018-07-23 DIAGNOSIS — Z825 Family history of asthma and other chronic lower respiratory diseases: Secondary | ICD-10-CM | POA: Diagnosis not present

## 2018-07-23 DIAGNOSIS — J690 Pneumonitis due to inhalation of food and vomit: Secondary | ICD-10-CM | POA: Diagnosis present

## 2018-07-23 DIAGNOSIS — K828 Other specified diseases of gallbladder: Secondary | ICD-10-CM | POA: Diagnosis present

## 2018-07-23 DIAGNOSIS — I340 Nonrheumatic mitral (valve) insufficiency: Secondary | ICD-10-CM | POA: Diagnosis present

## 2018-07-23 DIAGNOSIS — G9341 Metabolic encephalopathy: Secondary | ICD-10-CM | POA: Diagnosis present

## 2018-07-23 DIAGNOSIS — Z79899 Other long term (current) drug therapy: Secondary | ICD-10-CM | POA: Diagnosis not present

## 2018-07-23 DIAGNOSIS — J101 Influenza due to other identified influenza virus with other respiratory manifestations: Secondary | ICD-10-CM | POA: Diagnosis present

## 2018-07-23 LAB — CSF CELL COUNT WITH DIFFERENTIAL
Eosinophils, CSF: 0 %
Lymphs, CSF: 33 %
Monocyte-Macrophage-Spinal Fluid: 67 %
Other Cells, CSF: 0
RBC Count, CSF: 0 /cu mm (ref 0–3)
Segmented Neutrophils-CSF: 0 %
Tube #: 3
WBC, CSF: 8 /cu mm — ABNORMAL HIGH (ref 0–5)

## 2018-07-23 LAB — URINALYSIS, ROUTINE W REFLEX MICROSCOPIC
Bilirubin Urine: NEGATIVE
Glucose, UA: NEGATIVE mg/dL
Hgb urine dipstick: NEGATIVE
Ketones, ur: NEGATIVE mg/dL
Leukocytes, UA: NEGATIVE
Nitrite: NEGATIVE
PH: 6 (ref 5.0–8.0)
Protein, ur: NEGATIVE mg/dL
Specific Gravity, Urine: 1.021 (ref 1.005–1.030)

## 2018-07-23 LAB — TSH: TSH: 1.252 u[IU]/mL (ref 0.350–4.500)

## 2018-07-23 LAB — LACTIC ACID, PLASMA
Lactic Acid, Venous: 1.3 mmol/L (ref 0.5–1.9)
Lactic Acid, Venous: 4.3 mmol/L (ref 0.5–1.9)

## 2018-07-23 LAB — PROTEIN, CSF: Total  Protein, CSF: 21 mg/dL (ref 15–45)

## 2018-07-23 LAB — GLUCOSE, CSF: Glucose, CSF: 72 mg/dL — ABNORMAL HIGH (ref 40–70)

## 2018-07-23 LAB — PROCALCITONIN: Procalcitonin: <0.1 ng/mL

## 2018-07-23 MED ORDER — ACETAMINOPHEN 650 MG RE SUPP: 650.0000 mg | Freq: Four times a day (QID) | RECTAL | Status: DC | PRN

## 2018-07-23 MED ORDER — KETOROLAC TROMETHAMINE 30 MG/ML IJ SOLN
15.0000 mg | Freq: Once | INTRAMUSCULAR | Status: AC
  Administered 2018-07-23: 15 mg via INTRAVENOUS

## 2018-07-23 MED ORDER — SODIUM CHLORIDE 0.9 % IV SOLN
500.0000 mg | INTRAVENOUS | Status: DC
  Administered 2018-07-23: 500 mg via INTRAVENOUS
  Filled 2018-07-23: qty 500

## 2018-07-23 MED ORDER — ONDANSETRON HCL 4 MG/2ML IJ SOLN: 4.0000 mg | Freq: Four times a day (QID) | INTRAMUSCULAR | Status: DC | PRN

## 2018-07-23 MED ORDER — DOCUSATE SODIUM 100 MG PO CAPS
100.0000 mg | ORAL_CAPSULE | Freq: Two times a day (BID) | ORAL | Status: DC
  Administered 2018-07-23: 23:00:00 100 mg via ORAL
  Filled 2018-07-23 (×3): qty 1

## 2018-07-23 MED ORDER — SODIUM CHLORIDE 0.9 % IV SOLN: 1.0000 g | INTRAVENOUS | Status: DC

## 2018-07-23 MED ORDER — KETAMINE HCL 10 MG/ML IJ SOLN: 0.5000 mg/kg | Freq: Once | INTRAMUSCULAR | Status: DC

## 2018-07-23 MED ORDER — SODIUM CHLORIDE 0.9 % IV BOLUS
1000.0000 mL | Freq: Once | INTRAVENOUS | Status: AC
  Administered 2018-07-23: 1000 mL via INTRAVENOUS

## 2018-07-23 MED ORDER — ONDANSETRON HCL 4 MG PO TABS: 4.0000 mg | ORAL_TABLET | Freq: Four times a day (QID) | ORAL | Status: DC | PRN

## 2018-07-23 MED ORDER — SODIUM CHLORIDE 0.9 % IV SOLN
INTRAVENOUS | Status: DC
  Administered 2018-07-23 (×2): via INTRAVENOUS

## 2018-07-23 MED ORDER — SODIUM CHLORIDE 0.9 % IV SOLN: 500.0000 mg | INTRAVENOUS | Status: DC

## 2018-07-23 MED ORDER — ACETAMINOPHEN 325 MG PO TABS
650.0000 mg | ORAL_TABLET | Freq: Four times a day (QID) | ORAL | Status: DC | PRN
  Administered 2018-07-23 (×2): 650 mg via ORAL
  Filled 2018-07-23 (×2): qty 2

## 2018-07-23 MED ORDER — KETAMINE HCL 10 MG/ML IJ SOLN
INTRAMUSCULAR | Status: DC | PRN
  Administered 2018-07-23 (×3): 23.2 mg via INTRAVENOUS

## 2018-07-23 MED ORDER — LORAZEPAM 2 MG/ML IJ SOLN: 2.0000 mg | INTRAMUSCULAR | Status: DC | PRN

## 2018-07-23 MED ORDER — CARBAMAZEPINE ER 200 MG PO TB12
200.0000 mg | ORAL_TABLET | Freq: Every day | ORAL | Status: DC
  Administered 2018-07-23: 200 mg via ORAL
  Filled 2018-07-23 (×2): qty 1

## 2018-07-23 MED ORDER — OSELTAMIVIR PHOSPHATE 75 MG PO CAPS
75.0000 mg | ORAL_CAPSULE | Freq: Two times a day (BID) | ORAL | Status: DC
  Administered 2018-07-23 – 2018-07-24 (×3): 75 mg via ORAL
  Filled 2018-07-23 (×6): qty 1

## 2018-07-23 MED ORDER — SODIUM CHLORIDE 0.9 % IV BOLUS
1000.0000 mL | Freq: Once | INTRAVENOUS | Status: AC
  Administered 2018-07-22: 1000 mL via INTRAVENOUS

## 2018-07-23 MED ORDER — ENOXAPARIN SODIUM 40 MG/0.4ML ~~LOC~~ SOLN
40.0000 mg | SUBCUTANEOUS | Status: DC
  Administered 2018-07-23: 40 mg via SUBCUTANEOUS
  Filled 2018-07-23: qty 0.4

## 2018-07-23 MED ORDER — CARBAMAZEPINE ER 200 MG PO TB12
200.0000 mg | ORAL_TABLET | Freq: Once | ORAL | Status: AC
  Administered 2018-07-23: 200 mg via ORAL
  Filled 2018-07-23 (×2): qty 1

## 2018-07-23 MED ORDER — KETAMINE HCL 10 MG/ML IJ SOLN
INTRAMUSCULAR | Status: AC
  Filled 2018-07-23: qty 1

## 2018-07-23 MED ORDER — SODIUM CHLORIDE 0.9 % IV SOLN
3.0000 g | Freq: Four times a day (QID) | INTRAVENOUS | Status: DC
  Administered 2018-07-23 – 2018-07-24 (×5): 3 g via INTRAVENOUS
  Filled 2018-07-23 (×7): qty 3

## 2018-07-23 MED ORDER — KETOROLAC TROMETHAMINE 30 MG/ML IJ SOLN
INTRAMUSCULAR | Status: AC
  Filled 2018-07-23: qty 1

## 2018-07-23 MED ORDER — ACETAMINOPHEN 10 MG/ML IV SOLN
1000.0000 mg | Freq: Once | INTRAVENOUS | Status: AC
  Administered 2018-07-23: 1000 mg via INTRAVENOUS
  Filled 2018-07-23: qty 100

## 2018-07-23 NOTE — ED Notes (Signed)
Family at bedside. (father)

## 2018-07-23 NOTE — Progress Notes (Signed)
[employer] - [place] at [facility]   PATIENT NAME: Tony Harper    MR#:  [id number]  DATE OF BIRTH:  [birth year]  SUBJECTIVE:  CHIEF COMPLAINT:   Chief Complaint  Patient presents with  . Seizures   Patient seen and evaluated today Family was at bedside Has low blood pressure Febrile Has cough and nausea REVIEW OF SYSTEMS:    ROS  CONSTITUTIONAL: Has fever.  Has fatigue, weakness. No weight gain, no weight loss.  EYES: No blurry or double vision.  ENT: No tinnitus. No postnasal drip. No redness of the oropharynx.  RESPIRATORY: No cough, no wheeze, no hemoptysis. No dyspnea.  CARDIOVASCULAR: No chest pain. No orthopnea. No palpitations. No syncope.  GASTROINTESTINAL: Has nausea, no vomiting or diarrhea. No abdominal pain. No melena or hematochezia.  GENITOURINARY: No dysuria or hematuria.  ENDOCRINE: No polyuria or nocturia. No heat or cold intolerance.  HEMATOLOGY: No anemia. No bruising. No bleeding.  INTEGUMENTARY: No rashes. No lesions.  MUSCULOSKELETAL: No arthritis. No swelling. No gout.  NEUROLOGIC: No numbness, tingling, or ataxia. No seizure-type activity.  PSYCHIATRIC: No anxiety. No insomnia. No ADD.   DRUG ALLERGIES:  No Known Allergies  VITALS:  Blood pressure (!) 93/41, pulse (!) 135, temperature (!) 102.6 F (39.2 C), temperature source Oral, resp. rate 18, height 5\' 6"  (1.676 m), weight 46.4 kg, SpO2 97 %.  PHYSICAL EXAMINATION:   Physical Exam  GENERAL:  22 y.o.-year-old patient lying in the bed with no acute distress.  EYES: Pupils equal, round, reactive to light and accommodation. No scleral icterus. Extraocular muscles intact.  HEENT: Head atraumatic, normocephalic. Oropharynx dry and nasopharynx clear.  NECK:  Supple, no jugular venous distention. No thyroid enlargement, no tenderness.  LUNGS: Creased breath sounds bilaterally, bilateral rhonchi heard. No use of accessory muscles of respiration.  CARDIOVASCULAR: S1, S2 normal. No  murmurs, rubs, or gallops.  ABDOMEN: Soft, nontender, nondistended. Bowel sounds present. No organomegaly or mass.  EXTREMITIES: No cyanosis, clubbing or edema b/l.    NEUROLOGIC: Cranial nerves II through XII are intact. No focal Motor or sensory deficits b/l.   PSYCHIATRIC: Has cerebral palsy and retarded SKIN: No obvious rash, lesion, or ulcer.   LABORATORY PANEL:   CBC Recent Labs  Lab 07/22/18 2210  WBC 6.0  HGB 15.2  HCT 43.8  PLT 149*   ------------------------------------------------------------------------------------------------------------------ Chemistries  Recent Labs  Lab 07/22/18 2210  NA 136  K 3.6  CL 105  CO2 19*  GLUCOSE 146*  BUN 12  CREATININE 1.02  CALCIUM 8.4*  AST 28  ALT 12  ALKPHOS 113  BILITOT 0.6   ------------------------------------------------------------------------------------------------------------------  Cardiac Enzymes No results for input(s): TROPONINI in the last 168 hours. ------------------------------------------------------------------------------------------------------------------  RADIOLOGY:  Ct Head Wo Contrast  Result Date: 07/22/2018 CLINICAL DATA:  First seizure in 5 years. Altered level of consciousness, unexplained. Fever. EXAM: CT HEAD WITHOUT CONTRAST TECHNIQUE: Contiguous axial images were obtained from the base of the skull through the vertex without intravenous contrast. COMPARISON:  None. FINDINGS: Brain: No acute hemorrhage. Left parietal encephalomalacia with associated ex vacuo dilatation of the left lateral ventricle. No evidence of acute ischemia. No midline shift or mass effect. No hydrocephalus. The basilar cisterns are patent. No subdural collection. Vascular: No hyperdense vessel or unexpected calcification. Skull: No fracture or focal lesion. Sinuses/Orbits: Paranasal sinuses and mastoid air cells are clear. Small mucous retention cyst in the right maxillary sinus. The visualized orbits are unremarkable.  Other: None. IMPRESSION: 1. No acute intracranial abnormality.  2. Remote left parietal infarct with mild ex vacuo dilatation of the left lateral ventricle. Electronically Signed   By: Narda RutherfordMelanie  Sanford M.D.   On: 07/22/2018 23:15   Ct Chest W Contrast  Result Date: 07/22/2018 CLINICAL DATA:  Right upper quadrant pain. Sudden onset of fever. Seizure. EXAM: CT CHEST, ABDOMEN, AND PELVIS WITH CONTRAST TECHNIQUE: Multidetector CT imaging of the chest, abdomen and pelvis was performed following the standard protocol during bolus administration of intravenous contrast. CONTRAST:  100mL ISOVUE-300 IOPAMIDOL (ISOVUE-300) INJECTION 61% COMPARISON:  None. FINDINGS: CT CHEST FINDINGS Cardiovascular: No significant vascular findings. Normal heart size. No pericardial effusion. Mediastinum/Nodes: Triangular soft tissue density in the anterior mediastinum consistent with residual or recurrent thymus. Upper esophagus mildly patulous without wall thickening. No adenopathy. Lungs/Pleura: Tree in bud opacities in the right lower lobe with right lower lobe bronchial thickening. No confluent airspace disease. The left lung is clear. Mild motion artifact through the basis. No pulmonary edema or pleural fluid. Musculoskeletal: There are no acute or suspicious osseous abnormalities. CT ABDOMEN PELVIS FINDINGS Hepatobiliary: Motion artifact through the upper abdomen. Mild gallbladder distention without calcified gallstone. No biliary dilatation. No focal hepatic abnormality. Pancreas: Unremarkable allowing for motion artifact. Spleen: Normal in size without focal abnormality. Adrenals/Urinary Tract: Adrenal glands are unremarkable. Kidneys are normal, without renal calculi, focal lesion, or hydronephrosis. Bladder is nondistended but otherwise negative. Stomach/Bowel: Bowel evaluation is limited in the absence of enteric contrast, paucity of intra-abdominal fat, and motion artifact. No evidence of obstruction or bowel inflammation.  Normal appendix tentatively identified, for example image 48 series 5. Regardless, no pericecal inflammation to suggest appendicitis. No colonic wall thickening or inflammatory change. Vascular/Lymphatic: No significant vascular findings are present. No enlarged abdominal or pelvic lymph nodes. Reproductive: Prostate is unremarkable. Other: No free air or free fluid. Musculoskeletal: Mild scoliotic curvature of the lower lumbar spine. There are no acute or suspicious osseous abnormalities. IMPRESSION: 1. Tree in bud opacities in the right lower lobe with right lower lobe bronchial thickening. Findings may represent infectious bronchiolitis or aspiration. 2. Motion artifact through the gallbladder with mild gallbladder distention. If there is clinical concern for hepatobiliary pathology, recommend sonographic evaluation. Electronically Signed   By: Narda RutherfordMelanie  Sanford M.D.   On: 07/22/2018 23:24   Ct Abdomen Pelvis W Contrast  Result Date: 07/22/2018 CLINICAL DATA:  Right upper quadrant pain. Sudden onset of fever. Seizure. EXAM: CT CHEST, ABDOMEN, AND PELVIS WITH CONTRAST TECHNIQUE: Multidetector CT imaging of the chest, abdomen and pelvis was performed following the standard protocol during bolus administration of intravenous contrast. CONTRAST:  100mL ISOVUE-300 IOPAMIDOL (ISOVUE-300) INJECTION 61% COMPARISON:  None. FINDINGS: CT CHEST FINDINGS Cardiovascular: No significant vascular findings. Normal heart size. No pericardial effusion. Mediastinum/Nodes: Triangular soft tissue density in the anterior mediastinum consistent with residual or recurrent thymus. Upper esophagus mildly patulous without wall thickening. No adenopathy. Lungs/Pleura: Tree in bud opacities in the right lower lobe with right lower lobe bronchial thickening. No confluent airspace disease. The left lung is clear. Mild motion artifact through the basis. No pulmonary edema or pleural fluid. Musculoskeletal: There are no acute or suspicious  osseous abnormalities. CT ABDOMEN PELVIS FINDINGS Hepatobiliary: Motion artifact through the upper abdomen. Mild gallbladder distention without calcified gallstone. No biliary dilatation. No focal hepatic abnormality. Pancreas: Unremarkable allowing for motion artifact. Spleen: Normal in size without focal abnormality. Adrenals/Urinary Tract: Adrenal glands are unremarkable. Kidneys are normal, without renal calculi, focal lesion, or hydronephrosis. Bladder is nondistended but otherwise negative. Stomach/Bowel: Bowel evaluation is  limited in the absence of enteric contrast, paucity of intra-abdominal fat, and motion artifact. No evidence of obstruction or bowel inflammation. Normal appendix tentatively identified, for example image 48 series 5. Regardless, no pericecal inflammation to suggest appendicitis. No colonic wall thickening or inflammatory change. Vascular/Lymphatic: No significant vascular findings are present. No enlarged abdominal or pelvic lymph nodes. Reproductive: Prostate is unremarkable. Other: No free air or free fluid. Musculoskeletal: Mild scoliotic curvature of the lower lumbar spine. There are no acute or suspicious osseous abnormalities. IMPRESSION: 1. Tree in bud opacities in the right lower lobe with right lower lobe bronchial thickening. Findings may represent infectious bronchiolitis or aspiration. 2. Motion artifact through the gallbladder with mild gallbladder distention. If there is clinical concern for hepatobiliary pathology, recommend sonographic evaluation. Electronically Signed   By: Narda Rutherford M.D.   On: 07/22/2018 23:24   US Abdomen Limited Ruq  Result Date: 07/23/2018 CLINICAL DATA:  Right upper quadrant pain. EXAM: ULTRASOUND ABDOMEN LIMITED RIGHT UPPER QUADRANT COMPARISON:  Chest, abdomen and pelvis CT, 07/22/2018 FINDINGS: Gallbladder: No gallstones or wall thickening visualized. No sonographic Murphy sign noted by sonographer. Common bile duct: Diameter: 2 mm  Liver: No focal lesion identified. Within normal limits in parenchymal echogenicity. Portal vein is patent on color Doppler imaging with normal direction of blood flow towards the liver. IMPRESSION: Normal right upper quadrant ultrasound. No evidence of acute cholecystitis. No gallstones. Electronically Signed   By: Amie Portland M.D.   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 currently under hospitalist service for fever, flu  -Sepsis Continue IV Rocephin and Zithromax antibiotics Follow-up cultures Aggressive IV fluids Follow-up lactic acid  -Acute hypotension secondary to sepsis IV normal saline bolus Continue IV fluids Resuscitate aggressively IV antibiotics  -Breakthrough seizure Neurology consultation Continue Tegretol and PRN IV Ativan for seizures  -Bronchial asthma stable Albuterol inhaler as needed  -Flu syndrome Continue Tamiflu  -Right lung pneumonia could be from aspiration Continue Rocephin and Zithromax antibiotics Follow-up cultures Avoid aspiration  -DVT prophylaxis subcu Lovenox daily   All the records are reviewed and case discussed with Care Management/Social Worker. Management plans discussed with the patient, family and they are in agreement.  CODE STATUS: Full code  DVT Prophylaxis: SCDs  TOTAL CRITICAL CARE TIME TAKING CARE OF THIS PATIENT: 51 minutes.   POSSIBLE D/C IN 3 to 4 DAYS, DEPENDING ON CLINICAL CONDITION.  Ihor Austin M.D on 07/23/2018 at 12:07 PM  Between 7am to 6pm - Pager - [phone]  After 6pm go to [email or web address] - password [email or web address]  [facility]  Office  [phone]  CC: Primary care physician; Carlean Jews, NP  Note: This dictation was prepared with Dragon dictation along with smaller phrase technology. Any transcriptional errors that  result from this process are unintentional.

## 2018-07-23 NOTE — Progress Notes (Signed)
Notified Dr Tobi BastosPyreddy of last VS. Axillary temp 102.6, BP 93/41, HR 135. Tylenol given.  MD to order 1L bolus.

## 2018-07-23 NOTE — Consult Note (Signed)
Reason for Consult: seizure activity Referring Physician: Dr. Tobi Bastos  CC: seizure activity   HPI: Tony Harper is an 22 y.o. male with past medical history of cerebral palsy as well as seizure disorder presents to the emergency department after suffering a seizure that lasted a proximally 1 hour.  His mom states that it began as a focal seizure with his eyes looking toward the upper left and him saying "really, really".  Progressed to unilateral extremity tonic-clonic movement and then 2 full-body grand mal seizure activity.  He has not had a seizure in 5 years. Pt presented with high fevers.   Past Medical History:  Diagnosis Date  . Cerebral palsy (HCC)   . 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 Seizure disorder [facility])     History reviewed. No pertinent surgical history.  Family History  Problem Relation Age of Onset  . Hypertension Mother   . Asthma Mother   . Asthma Sister   . Hypertension Maternal Aunt   . Hypertension Maternal Grandmother   . Stroke Maternal Grandmother   . Cancer Maternal Grandfather   . Stroke Paternal Grandmother     Social History:  reports that he has never smoked. He has never used smokeless tobacco. He reports that he does not drink alcohol or use drugs.  No Known Allergies  Medications: I have reviewed the patient's current medications.  ROS: Unable to obtain due to cognitive disjunction   Physical Examination: Blood pressure (!) 93/41, pulse (!) 135, temperature (!) 102.6 F (39.2 C), temperature source Oral, resp. rate 18, height 5\' 6"  (1.676 m), weight 46.4 kg, SpO2 97 %.    Neurological Examination   Mental Status: Alert to name only.  Cranial Nerves: II: Discs flat bilaterally; Visual fields grossly normal, pupils equal, round, reactive to light and accommodation III,IV, VI: ptosis not present, extra-ocular motions intact bilaterally V,VII: smile  symmetric, facial light touch sensation normal bilaterally VIII: hearing normal bilaterally IX,X: gag reflex present XI: bilateral shoulder shrug XII: midline tongue extension Motor: Generalize weakness.  Tone and bulk:normal tone throughout; no atrophy noted Deep Tendon Reflexes: 1+ and symmetric throughout Plantars: Right: downgoing   Left: downgoing Cerebellar: Not tested       Laboratory Studies:   Basic Metabolic Panel: Recent Labs  Lab 07/22/18 2210  NA 136  K 3.6  CL 105  CO2 19*  GLUCOSE 146*  BUN 12  CREATININE 1.02  CALCIUM 8.4*    Liver Function Tests: Recent Labs  Lab 07/22/18 2210  AST 28  ALT 12  ALKPHOS 113  BILITOT 0.6  PROT 6.8  ALBUMIN 4.2   No results for input(s): LIPASE, AMYLASE in the last 168 hours. No results for input(s): AMMONIA in the last 168 hours.  CBC: Recent Labs  Lab 07/22/18 2210  WBC 6.0  NEUTROABS 4.8  HGB 15.2  HCT 43.8  MCV 85.7  PLT 149*    Cardiac Enzymes: No results for input(s): CKTOTAL, CKMB, CKMBINDEX, TROPONINI in the last 168 hours.  BNP: Invalid input(s): POCBNP  CBG: No results for input(s): GLUCAP in the last 168 hours.  Microbiology: Results for orders placed or performed during the hospital encounter of 07/22/18  Blood Culture (routine x 2)     Status: None (Preliminary result)   Collection Time: 07/22/18 10:10 PM  Result Value Ref Range Status   Specimen Description BLOOD LEFT FOREARM  Final   Special  Requests   Final    BOTTLES DRAWN AEROBIC AND ANAEROBIC Blood Culture adequate volume   Culture   Final    NO GROWTH < 12 HOURS Performed at [facility], [address] [place], [place] [address]    Report Status PENDING  Incomplete  Blood Culture (routine x 2)     Status: None (Preliminary result)   Collection Time: 07/22/18 10:24 PM  Result Value Ref Range Status   Specimen Description BLOOD LEFT ANTECUBITAL  Final   Special Requests   Final    BOTTLES DRAWN AEROBIC AND  ANAEROBIC Blood Culture adequate volume   Culture   Final    NO GROWTH < 12 HOURS Performed at [facility], [address] [place], [place] [address]    Report Status PENDING  Incomplete    Coagulation Studies: No results for input(s): LABPROT, INR in the last 72 hours.  Urinalysis:  Recent Labs  Lab 07/23/18 0052  COLORURINE STRAW*  LABSPEC 1.021  PHURINE 6.0  GLUCOSEU NEGATIVE  HGBUR NEGATIVE  BILIRUBINUR NEGATIVE  KETONESUR NEGATIVE  PROTEINUR NEGATIVE  NITRITE NEGATIVE  LEUKOCYTESUR NEGATIVE    Lipid Panel:  No results found for: CHOL, TRIG, HDL, CHOLHDL, VLDL, LDLCALC  HgbA1C: No results found for: HGBA1C  Urine Drug Screen:  No results found for: LABOPIA, COCAINSCRNUR, LABBENZ, AMPHETMU, THCU, LABBARB  Alcohol Level: No results for input(s): ETH in the last 168 hours.  Other results: EKG: normal EKG, normal sinus rhythm, unchanged from previous tracings.  Imaging: Ct Head Wo Contrast  Result Date: 07/22/2018 CLINICAL DATA:  First seizure in 5 years. Altered level of consciousness, unexplained. Fever. EXAM: CT HEAD WITHOUT CONTRAST TECHNIQUE: Contiguous axial images were obtained from the base of the skull through the vertex without intravenous contrast. COMPARISON:  None. FINDINGS: Brain: No acute hemorrhage. Left parietal encephalomalacia with associated ex vacuo dilatation of the left lateral ventricle. No evidence of acute ischemia. No midline shift or mass effect. No hydrocephalus. The basilar cisterns are patent. No subdural collection. Vascular: No hyperdense vessel or unexpected calcification. Skull: No fracture or focal lesion. Sinuses/Orbits: Paranasal sinuses and mastoid air cells are clear. Small mucous retention cyst in the right maxillary sinus. The visualized orbits are unremarkable. Other: None. IMPRESSION: 1. No acute intracranial abnormality. 2. Remote left parietal infarct with mild ex vacuo dilatation of the left lateral ventricle.  Electronically Signed   By: Narda Rutherford M.D.   On: 07/22/2018 23:15   Ct Chest W Contrast  Result Date: 07/22/2018 CLINICAL DATA:  Right upper quadrant pain. Sudden onset of fever. Seizure. EXAM: CT CHEST, ABDOMEN, AND PELVIS WITH CONTRAST TECHNIQUE: Multidetector CT imaging of the chest, abdomen and pelvis was performed following the standard protocol during bolus administration of intravenous contrast. CONTRAST:  ISOVUE-300 IOPAMIDOL (ISOVUE-300) INJECTION 61% COMPARISON:  None. FINDINGS: CT CHEST FINDINGS Cardiovascular: No significant vascular findings. Normal heart size. No pericardial effusion. Mediastinum/Nodes: Triangular soft tissue density in the anterior mediastinum consistent with residual or recurrent thymus. Upper esophagus mildly patulous without wall thickening. No adenopathy. Lungs/Pleura: Tree in bud opacities in the right lower lobe with right lower lobe bronchial thickening. No confluent airspace disease. The left lung is clear. Mild motion artifact through the basis. No pulmonary edema or pleural fluid. Musculoskeletal: There are no acute or suspicious osseous abnormalities. CT ABDOMEN PELVIS FINDINGS Hepatobiliary: Motion artifact through the upper abdomen. Mild gallbladder distention without calcified gallstone. No biliary dilatation. No focal hepatic abnormality. Pancreas: Unremarkable allowing for motion artifact.  Spleen: Normal in size without focal abnormality. Adrenals/Urinary Tract: Adrenal glands are unremarkable. Kidneys are normal, without renal calculi, focal lesion, or hydronephrosis. Bladder is nondistended but otherwise negative. Stomach/Bowel: Bowel evaluation is limited in the absence of enteric contrast, paucity of intra-abdominal fat, and motion artifact. No evidence of obstruction or bowel inflammation. Normal appendix tentatively identified, for example image 48 series 5. Regardless, no pericecal inflammation to suggest appendicitis. No colonic wall  thickening or inflammatory change. Vascular/Lymphatic: No significant vascular findings are present. No enlarged abdominal or pelvic lymph nodes. Reproductive: Prostate is unremarkable. Other: No free air or free fluid. Musculoskeletal: Mild scoliotic curvature of the lower lumbar spine. There are no acute or suspicious osseous abnormalities. IMPRESSION: 1. Tree in bud opacities in the right lower lobe with right lower lobe bronchial thickening. Findings may represent infectious bronchiolitis or aspiration. 2. Motion artifact through the gallbladder with mild gallbladder distention. If there is clinical concern for hepatobiliary pathology, recommend sonographic evaluation. Electronically Signed   By: Narda Rutherford M.D.   On: 07/22/2018 23:24   Ct Abdomen Pelvis W Contrast  Result Date: 07/22/2018 CLINICAL DATA:  Right upper quadrant pain. Sudden onset of fever. Seizure. EXAM: CT CHEST, ABDOMEN, AND PELVIS WITH CONTRAST TECHNIQUE: Multidetector CT imaging of the chest, abdomen and pelvis was performed following the standard protocol during bolus administration of intravenous contrast. CONTRAST:  ISOVUE-300 IOPAMIDOL (ISOVUE-300) INJECTION 61% COMPARISON:  None. FINDINGS: CT CHEST FINDINGS Cardiovascular: No significant vascular findings. Normal heart size. No pericardial effusion. Mediastinum/Nodes: Triangular soft tissue density in the anterior mediastinum consistent with residual or recurrent thymus. Upper esophagus mildly patulous without wall thickening. No adenopathy. Lungs/Pleura: Tree in bud opacities in the right lower lobe with right lower lobe bronchial thickening. No confluent airspace disease. The left lung is clear. Mild motion artifact through the basis. No pulmonary edema or pleural fluid. Musculoskeletal: There are no acute or suspicious osseous abnormalities. CT ABDOMEN PELVIS FINDINGS Hepatobiliary: Motion artifact through the upper abdomen. Mild gallbladder distention without  calcified gallstone. No biliary dilatation. No focal hepatic abnormality. Pancreas: Unremarkable allowing for motion artifact. Spleen: Normal in size without focal abnormality. Adrenals/Urinary Tract: Adrenal glands are unremarkable. Kidneys are normal, without renal calculi, focal lesion, or hydronephrosis. Bladder is nondistended but otherwise negative. Stomach/Bowel: Bowel evaluation is limited in the absence of enteric contrast, paucity of intra-abdominal fat, and motion artifact. No evidence of obstruction or bowel inflammation. Normal appendix tentatively identified, for example image 48 series 5. Regardless, no pericecal inflammation to suggest appendicitis. No colonic wall thickening or inflammatory change. Vascular/Lymphatic: No significant vascular findings are present. No enlarged abdominal or pelvic lymph nodes. Reproductive: Prostate is unremarkable. Other: No free air or free fluid. Musculoskeletal: Mild scoliotic curvature of the lower lumbar spine. There are no acute or suspicious osseous abnormalities. IMPRESSION: 1. Tree in bud opacities in the right lower lobe with right lower lobe bronchial thickening. Findings may represent infectious bronchiolitis or aspiration. 2. Motion artifact through the gallbladder with mild gallbladder distention. If there is clinical concern for hepatobiliary pathology, recommend sonographic evaluation. Electronically Signed   By: Narda Rutherford M.D.   On: 07/22/2018 23:24   US Abdomen Limited Ruq  Result Date: 07/23/2018 CLINICAL DATA:  Right upper quadrant pain. EXAM: ULTRASOUND ABDOMEN LIMITED RIGHT UPPER QUADRANT COMPARISON:  Chest, abdomen and pelvis CT, 07/22/2018 FINDINGS: Gallbladder: No gallstones or wall thickening visualized. No sonographic Murphy sign noted by sonographer. Common bile duct: Diameter: 2 mm Liver: No focal lesion identified. Within normal limits  in parenchymal echogenicity. Portal vein is patent on color Doppler imaging with normal  direction of blood flow towards the liver. IMPRESSION: Normal right upper quadrant ultrasound. No evidence of acute cholecystitis. No gallstones. Electronically Signed   By: Amie Portlandavid  Ormond M.D.   On: 07/23/2018 00:40     Assessment/Plan:  22 y.o. male with past medical history of cerebral palsy as well as seizure disorder presents to the emergency department after suffering a seizure that lasted a proximally 1 hour.  His mom states that it began as a focal seizure with his eyes looking toward the upper left and him saying "really, really".  Progressed to unilateral extremity tonic-clonic movement and then 2 full-body grand mal seizure activity.  He has not had a seizure in 5 years. Pt presented with high fevers.   - Seizure in setting of infection which is being treated by primary team - Pt is on long term anticonvulsant.  - No change in antiepileptics at this time - no further imaging from neuro stand point 07/23/2018, 12:15 PM

## 2018-07-23 NOTE — H&P (Signed)
Tony Harper is an 22 y.o. male.   Chief Complaint: Seizure HPI: The patient with past medical history of cerebral palsy as well as seizure disorder presents to the emergency department after suffering a seizure that lasted a proximally 1 hour.  His mom states that it began as a focal seizure with his eyes looking toward the upper left and him saying "really, really".  Progressed to unilateral extremity tonic-clonic movement and then 2 full-body grand mal seizure activity.  He has not had a seizure in 5 years.  Reports that he had been in his usual state of health earlier in the day but developed a fever of 103 F.  He was given a total of 4 mg of Ativan in the emergency department and placed on supplemental O2.  Past Medical History:  Diagnosis Date  . Cerebral palsy (HCC)   . 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 Seizure disorder [facility])     History reviewed. No pertinent surgical history. None  Family History  Problem Relation Age of Onset  . Hypertension Mother   . Asthma Mother   . Asthma Sister   . Hypertension Maternal Aunt   . Hypertension Maternal Grandmother   . Stroke Maternal Grandmother   . Cancer Maternal Grandfather   . Stroke Paternal Grandmother    Social History:  reports that he has never smoked. He has never used smokeless tobacco. He reports that he does not drink alcohol or use drugs.  Allergies: No Known Allergies  Medications Prior to Admission  Medication Sig Dispense Refill  . carbamazepine (CARBATROL) 200 MG 12 hr capsule Take 200 mg by mouth at bedtime.  0    Results for orders placed or performed during the hospital encounter of 07/22/18 (from the past 48 hour(s))  Comprehensive metabolic panel     Status: Abnormal   Collection Time: 07/22/18 10:10 PM  Result Value Ref Range   Sodium 136 135 - 145 mmol/L   Potassium 3.6 3.5 - 5.1 mmol/L   Chloride 105 98 - 111 mmol/L    CO2 19 (L) 22 - 32 mmol/L   Glucose, Bld 146 (H) 70 - 99 mg/dL   BUN 12 6 - 20 mg/dL   Creatinine, Ser 1.61 0.61 - 1.24 mg/dL   Calcium 8.4 (L) 8.9 - 10.3 mg/dL   Total Protein 6.8 6.5 - 8.1 g/dL   Albumin 4.2 3.5 - 5.0 g/dL   AST 28 15 - 41 U/L   ALT 12 0 - 44 U/L   Alkaline Phosphatase 113 38 - 126 U/L   Total Bilirubin 0.6 0.3 - 1.2 mg/dL   GFR calc non Af Amer >60 >60 mL/min   GFR calc Af Amer >60 >60 mL/min   Anion gap 12 5 - 15    Comment: Performed at [facility], [address] [place], [place] [address]  CBC WITH DIFFERENTIAL     Status: Abnormal   Collection Time: 07/22/18 10:10 PM  Result Value Ref Range   WBC 6.0 4.0 - 10.5 K/uL   RBC 5.11 4.22 - 5.81 MIL/uL   Hemoglobin 15.2 13.0 - 17.0 g/dL   HCT 54.0 98.1 - 19.1 %   MCV 85.7 80.0 - 100.0 fL   MCH 29.7 26.0 - 34.0 pg   MCHC 34.7 30.0 - 36.0 g/dL   RDW 47.8 29.5 - 62.1 %   Platelets 149 (L) 150 - 400  K/uL   nRBC 0.0 0.0 - 0.2 %   Neutrophils Relative % 81 %   Neutro Abs 4.8 1.7 - 7.7 K/uL   Lymphocytes Relative 8 %   Lymphs Abs 0.5 (L) 0.7 - 4.0 K/uL   Monocytes Relative 10 %   Monocytes Absolute 0.6 0.1 - 1.0 K/uL   Eosinophils Relative 0 %   Eosinophils Absolute 0.0 0.0 - 0.5 K/uL   Basophils Relative 1 %   Basophils Absolute 0.1 0.0 - 0.1 K/uL   Immature Granulocytes 0 %   Abs Immature Granulocytes 0.02 0.00 - 0.07 K/uL    Comment: Performed at [facility], [address] [place], [place] [address]  Blood Culture (routine x 2)     Status: None (Preliminary result)   Collection Time: 07/22/18 10:10 PM  Result Value Ref Range   Specimen Description BLOOD LEFT FOREARM    Special Requests      BOTTLES DRAWN AEROBIC AND ANAEROBIC Blood Culture adequate volume   Culture      NO GROWTH < 12 HOURS Performed at [facility], [address], [place], [place] [address]    Report Status PENDING   Phenytoin level, total     Status: Abnormal   Collection Time: 07/22/18 10:10  PM  Result Value Ref Range   Phenytoin Lvl <2.5 (L) 10.0 - 20.0 ug/mL    Comment: Performed at [facility], [address] [place], [place] [address]  Carbamazepine level, total     Status: Abnormal   Collection Time: 07/22/18 10:10 PM  Result Value Ref Range   Carbamazepine Lvl 2.4 (L) 4.0 - 12.0 ug/mL    Comment: Performed at [facility], [address] [place], [place] [address]  Lactic acid, plasma     Status: Abnormal   Collection Time: 07/22/18 10:10 PM  Result Value Ref Range   Lactic Acid, Venous 4.3 (HH) 0.5 - 1.9 mmol/L    Comment: CRITICAL RESULT CALLED TO, READ BACK BY AND VERIFIED WITH NOEL WEBSTER ON 07/23/18 AT 0016 [facility] Performed at [facility], [address] [place], [place] [address]   TSH     Status: None   Collection Time: 07/22/18 10:10 PM  Result Value Ref Range   TSH 1.252 0.350 - 4.500 uIU/mL    Comment: Performed by a 3rd Generation assay with a functional sensitivity of <=0.01 uIU/mL. Performed at [facility], [address], [place], [place] [address]   Blood Culture (routine x 2)     Status: None (Preliminary result)   Collection Time: 07/22/18 10:24 PM  Result Value Ref Range   Specimen Description BLOOD LEFT ANTECUBITAL    Special Requests      BOTTLES DRAWN AEROBIC AND ANAEROBIC Blood Culture adequate volume   Culture      NO GROWTH < 12 HOURS Performed at [facility], [address], [place], [place] [address]    Report Status PENDING   Influenza panel by PCR (type A & B)     Status: Abnormal   Collection Time: 07/22/18 10:24 PM  Result Value Ref Range   Influenza A By PCR POSITIVE (A) NEGATIVE   Influenza B By PCR NEGATIVE NEGATIVE    Comment: (NOTE) The Xpert Xpress Flu assay is intended as an aid in the diagnosis of  influenza and should not be used as a sole basis for treatment.  This  assay is FDA approved for nasopharyngeal swab specimens only. Nasal  washings and  aspirates are  unacceptable for Xpert Xpress Flu testing. Performed at [facility], [address] [place], [place] [address]   CSF cell count with differential collection tube #: 3     Status: Abnormal   Collection Time: 07/23/18 12:49 AM  Result Value Ref Range   Tube # 3    Color, CSF COLORLESS COLORLESS   Appearance, CSF CLEAR CLEAR   Supernatant CLEAR    RBC Count, CSF 0 0 - 3 /cu mm   WBC, CSF 8 (H) 0 - 5 /cu mm   Segmented Neutrophils-CSF 0 %   Lymphs, CSF 33 %   Monocyte-Macrophage-Spinal Fluid 67 %   Eosinophils, CSF 0 %   Other Cells, CSF 0     Comment: Performed at [facility], [address] [place], [place] [address]  Urinalysis, Routine w reflex microscopic     Status: Abnormal   Collection Time: 07/23/18 12:52 AM  Result Value Ref Range   Color, Urine STRAW (A) YELLOW   APPearance CLEAR (A) CLEAR   Specific Gravity, Urine 1.021 1.005 - 1.030   pH 6.0 5.0 - 8.0   Glucose, UA NEGATIVE NEGATIVE mg/dL   Hgb urine dipstick NEGATIVE NEGATIVE   Bilirubin Urine NEGATIVE NEGATIVE   Ketones, ur NEGATIVE NEGATIVE mg/dL   Protein, ur NEGATIVE NEGATIVE mg/dL   Nitrite NEGATIVE NEGATIVE   Leukocytes, UA NEGATIVE NEGATIVE    Comment: Performed at [facility], [address] [place], [place] [address]  Lactic acid, plasma     Status: None   Collection Time: 07/23/18 12:52 AM  Result Value Ref Range   Lactic Acid, Venous 1.3 0.5 - 1.9 mmol/L    Comment: Performed at [facility], [address] [place], [place] [address]  Glucose, CSF     Status: Abnormal   Collection Time: 07/23/18  1:19 AM  Result Value Ref Range   Glucose, CSF 72 (H) 40 - 70 mg/dL    Comment: Performed at [facility], [address] [place], [place] [address]  Protein, CSF     Status: None   Collection Time: 07/23/18  1:19 AM  Result Value Ref Range   Total  Protein, CSF 21 15 - 45 mg/dL    Comment: Performed at [facility], [address]  [place], [place] [address]   Ct Head Wo Contrast  Result Date: 07/22/2018 CLINICAL DATA:  First seizure in 5 years. Altered level of consciousness, unexplained. Fever. EXAM: CT HEAD WITHOUT CONTRAST TECHNIQUE: Contiguous axial images were obtained from the base of the skull through the vertex without intravenous contrast. COMPARISON:  None. FINDINGS: Brain: No acute hemorrhage. Left parietal encephalomalacia with associated ex vacuo dilatation of the left lateral ventricle. No evidence of acute ischemia. No midline shift or mass effect. No hydrocephalus. The basilar cisterns are patent. No subdural collection. Vascular: No hyperdense vessel or unexpected calcification. Skull: No fracture or focal lesion. Sinuses/Orbits: Paranasal sinuses and mastoid air cells are clear. Small mucous retention cyst in the right maxillary sinus. The visualized orbits are unremarkable. Other: None. IMPRESSION: 1. No acute intracranial abnormality. 2. Remote left parietal infarct with mild ex vacuo dilatation of the left lateral ventricle. Electronically Signed   By: Narda RutherfordMelanie  Sanford M.D.   On: 07/22/2018 23:15   Ct Chest W Contrast  Result Date: 07/22/2018 CLINICAL DATA:  Right upper quadrant pain. Sudden onset of fever. Seizure. EXAM: CT CHEST, ABDOMEN, AND PELVIS WITH CONTRAST TECHNIQUE: Multidetector CT imaging of the  chest, abdomen and pelvis was performed following the standard protocol during bolus administration of intravenous contrast. CONTRAST:  ISOVUE-300 IOPAMIDOL (ISOVUE-300) INJECTION 61% COMPARISON:  None. FINDINGS: CT CHEST FINDINGS Cardiovascular: No significant vascular findings. Normal heart size. No pericardial effusion. Mediastinum/Nodes: Triangular soft tissue density in the anterior mediastinum consistent with residual or recurrent thymus. Upper esophagus mildly patulous without wall thickening. No adenopathy. Lungs/Pleura: Tree in bud opacities in the right lower lobe with right lower lobe bronchial  thickening. No confluent airspace disease. The left lung is clear. Mild motion artifact through the basis. No pulmonary edema or pleural fluid. Musculoskeletal: There are no acute or suspicious osseous abnormalities. CT ABDOMEN PELVIS FINDINGS Hepatobiliary: Motion artifact through the upper abdomen. Mild gallbladder distention without calcified gallstone. No biliary dilatation. No focal hepatic abnormality. Pancreas: Unremarkable allowing for motion artifact. Spleen: Normal in size without focal abnormality. Adrenals/Urinary Tract: Adrenal glands are unremarkable. Kidneys are normal, without renal calculi, focal lesion, or hydronephrosis. Bladder is nondistended but otherwise negative. Stomach/Bowel: Bowel evaluation is limited in the absence of enteric contrast, paucity of intra-abdominal fat, and motion artifact. No evidence of obstruction or bowel inflammation. Normal appendix tentatively identified, for example image 48 series 5. Regardless, no pericecal inflammation to suggest appendicitis. No colonic wall thickening or inflammatory change. Vascular/Lymphatic: No significant vascular findings are present. No enlarged abdominal or pelvic lymph nodes. Reproductive: Prostate is unremarkable. Other: No free air or free fluid. Musculoskeletal: Mild scoliotic curvature of the lower lumbar spine. There are no acute or suspicious osseous abnormalities. IMPRESSION: 1. Tree in bud opacities in the right lower lobe with right lower lobe bronchial thickening. Findings may represent infectious bronchiolitis or aspiration. 2. Motion artifact through the gallbladder with mild gallbladder distention. If there is clinical concern for hepatobiliary pathology, recommend sonographic evaluation. Electronically Signed   By: Narda Rutherford M.D.   On: 07/22/2018 23:24   Ct Abdomen Pelvis W Contrast  Result Date: 07/22/2018 CLINICAL DATA:  Right upper quadrant pain. Sudden onset of fever. Seizure. EXAM: CT CHEST, ABDOMEN, AND  PELVIS WITH CONTRAST TECHNIQUE: Multidetector CT imaging of the chest, abdomen and pelvis was performed following the standard protocol during bolus administration of intravenous contrast. CONTRAST:  ISOVUE-300 IOPAMIDOL (ISOVUE-300) INJECTION 61% COMPARISON:  None. FINDINGS: CT CHEST FINDINGS Cardiovascular: No significant vascular findings. Normal heart size. No pericardial effusion. Mediastinum/Nodes: Triangular soft tissue density in the anterior mediastinum consistent with residual or recurrent thymus. Upper esophagus mildly patulous without wall thickening. No adenopathy. Lungs/Pleura: Tree in bud opacities in the right lower lobe with right lower lobe bronchial thickening. No confluent airspace disease. The left lung is clear. Mild motion artifact through the basis. No pulmonary edema or pleural fluid. Musculoskeletal: There are no acute or suspicious osseous abnormalities. CT ABDOMEN PELVIS FINDINGS Hepatobiliary: Motion artifact through the upper abdomen. Mild gallbladder distention without calcified gallstone. No biliary dilatation. No focal hepatic abnormality. Pancreas: Unremarkable allowing for motion artifact. Spleen: Normal in size without focal abnormality. Adrenals/Urinary Tract: Adrenal glands are unremarkable. Kidneys are normal, without renal calculi, focal lesion, or hydronephrosis. Bladder is nondistended but otherwise negative. Stomach/Bowel: Bowel evaluation is limited in the absence of enteric contrast, paucity of intra-abdominal fat, and motion artifact. No evidence of obstruction or bowel inflammation. Normal appendix tentatively identified, for example image 48 series 5. Regardless, no pericecal inflammation to suggest appendicitis. No colonic wall thickening or inflammatory change. Vascular/Lymphatic: No significant vascular findings are present. No enlarged abdominal or pelvic lymph nodes. Reproductive: Prostate is unremarkable. Other: No  free air or free fluid. Musculoskeletal:  Mild scoliotic curvature of the lower lumbar spine. There are no acute or suspicious osseous abnormalities. IMPRESSION: 1. Tree in bud opacities in the right lower lobe with right lower lobe bronchial thickening. Findings may represent infectious bronchiolitis or aspiration. 2. Motion artifact through the gallbladder with mild gallbladder distention. If there is clinical concern for hepatobiliary pathology, recommend sonographic evaluation. Electronically Signed   By: Narda Rutherford M.D.   On: 07/22/2018 23:24   US Abdomen Limited Ruq  Result Date: 07/23/2018 CLINICAL DATA:  Right upper quadrant pain. EXAM: ULTRASOUND ABDOMEN LIMITED RIGHT UPPER QUADRANT COMPARISON:  Chest, abdomen and pelvis CT, 07/22/2018 FINDINGS: Gallbladder: No gallstones or wall thickening visualized. No sonographic Murphy sign noted by sonographer. Common bile duct: Diameter: 2 mm Liver: No focal lesion identified. Within normal limits in parenchymal echogenicity. Portal vein is patent on color Doppler imaging with normal direction of blood flow towards the liver. IMPRESSION: Normal right upper quadrant ultrasound. No evidence of acute cholecystitis. No gallstones. Electronically Signed   By: Amie Portland M.D.   On: 07/23/2018 00:40    Review of Systems  Unable to perform ROS: Mental status change    Blood pressure 106/65, pulse (!) 117, temperature 98 F (36.7 C), temperature source Oral, resp. rate (!) 21, height 5\' 6"  (1.676 m), weight 46.4 kg, SpO2 100 %. Physical Exam  Vitals reviewed. Constitutional: He appears well-developed and well-nourished. No distress.  HENT:  Head: Normocephalic and atraumatic.  Mouth/Throat: Oropharynx is clear and moist.  Eyes: Pupils are equal, round, and reactive to light. Conjunctivae and EOM are normal. No scleral icterus.  Neck: Normal range of motion. Neck supple. No JVD present. No tracheal deviation present. No thyromegaly present.  Cardiovascular: Normal rate, regular rhythm  and normal heart sounds. Exam reveals no gallop and no friction rub.  No murmur heard. Respiratory: Effort normal and breath sounds normal. No respiratory distress.  GI: Soft. Bowel sounds are normal. He exhibits no distension. There is no tenderness.  Genitourinary:  Genitourinary Comments: Deferred  Musculoskeletal: Normal range of motion. He exhibits no edema.  Lymphadenopathy:    He has no cervical adenopathy.  Neurological: No cranial nerve deficit.  Post-ictal  Skin: Skin is warm and dry. No rash noted. No erythema.  Psychiatric:  Patient is somnolent     Assessment/Plan This is a 22 year old male admitted for sepsis. 1.  Sepsis: The patient meets criteria via fever, leukocytosis and tachycardia.  Positive for influenza A.  Continue Tamiflu.  Also concern for aspiration pneumonia.  Continue azithromycin and ceftriaxone.  Follow blood cultures for growth and sensitivities.  The patient does not have shock.  Lactic acid elevated as expected following seizure and also in the presence of sepsis. 2.  Seizure: Seizure threshold likely lowered due to fever.  Manage fever and continue Tegretol.  He has not been loaded with IV antiepileptic medication.  Consult neurology.  Ativan as needed for seizures 3.  Asthma: Controlled; albuterol as needed. 4.  DVT prophylaxis: Lovenox 5.  GI prophylaxis: None The patient is a full code.  Time spent on admission orders and patient care approximately 45 minutes  Arnaldo Natal, MD 07/23/2018, 9:44 AM

## 2018-07-23 NOTE — ED Notes (Signed)
Pt refusing to drink or take PO meds

## 2018-07-23 NOTE — ED Notes (Signed)
CRITICAL LAB: LACTIC is 4.3, [facility], Dr. Darnelle CatalanMalinda notified, orders received

## 2018-07-23 NOTE — Progress Notes (Signed)
Pt arrived to the floor, this RN attempted to assess Pt several times. Pt refused to be touched. This RN discussed with parents about the issue. Parents decided to let the patient rest for now. Will continue to monitor.

## 2018-07-23 NOTE — ED Notes (Signed)
Report finished, securing transport

## 2018-07-24 LAB — PROCALCITONIN: Procalcitonin: 1.19 ng/mL

## 2018-07-24 LAB — BASIC METABOLIC PANEL
Anion gap: 6 (ref 5–15)
BUN: 7 mg/dL (ref 6–20)
CO2: 23 mmol/L (ref 22–32)
Calcium: 7.9 mg/dL — ABNORMAL LOW (ref 8.9–10.3)
Chloride: 109 mmol/L (ref 98–111)
Creatinine, Ser: 0.9 mg/dL (ref 0.61–1.24)
GFR calc Af Amer: >60 mL/min (ref >60)
GFR calc non Af Amer: >60 mL/min (ref >60)
Glucose, Bld: 81 mg/dL (ref 70–99)
POTASSIUM: 3.8 mmol/L (ref 3.5–5.1)
Sodium: 138 mmol/L (ref 135–145)

## 2018-07-24 LAB — CBC
HCT: 38.6 % — ABNORMAL LOW (ref 39.0–52.0)
Hemoglobin: 13.1 g/dL (ref 13.0–17.0)
MCH: 30 pg (ref 26.0–34.0)
MCHC: 33.9 g/dL (ref 30.0–36.0)
MCV: 88.3 fL (ref 80.0–100.0)
Platelets: 101 10*3/uL — ABNORMAL LOW (ref 150–400)
RBC: 4.37 MIL/uL (ref 4.22–5.81)
RDW: 12 % (ref 11.5–15.5)
WBC: 5.8 10*3/uL (ref 4.0–10.5)
nRBC: 0 % (ref 0.0–0.2)

## 2018-07-24 MED ORDER — CLINDAMYCIN HCL 300 MG PO CAPS: 300.0000 mg | ORAL_CAPSULE | Freq: Three times a day (TID) | ORAL | 0 refills | Status: AC

## 2018-07-24 MED ORDER — OSELTAMIVIR PHOSPHATE 75 MG PO CAPS: 75.0000 mg | ORAL_CAPSULE | Freq: Two times a day (BID) | ORAL | 0 refills | Status: AC

## 2018-07-24 NOTE — Progress Notes (Signed)
Patient is refusing to wear the telemetry box. This nurse spent 25 minutes providing patient education to the patient regarding the purpose and function of the telemetry box. Patient mother, at bedside, made several attempts to get the patient to wear the telemetry box. Patient adamantly refused. Patient verbalized and demonstrated understanding of patient education provided by RN regarding purpose and function of the telemetry box. MD notified.

## 2018-07-24 NOTE — Discharge Summary (Signed)
[facility] [place] at [facility]   PATIENT NAME: Tony Harper    MR#:  [id number]  DATE OF BIRTH:  1996-05-18  DATE OF ADMISSION:  07/22/2018 ADMITTING PHYSICIAN: Arnaldo Natal, MD  DATE OF DISCHARGE: 07/24/2018  1:00 PM  PRIMARY CARE PHYSICIAN: Carlean Jews, NP   ADMISSION DIAGNOSIS:  Flu [J11.1] Seizure (HCC) [R56.9] Sepsis associated hypotension (HCC) [A41.9, I95.9] Right upper quadrant abdominal pain [R10.11] Altered mental status, unspecified altered mental status type [R41.82]  DISCHARGE DIAGNOSIS:  Flu syndrome Dehydration Abdominal pain Altered mental status from metabolic encephalopathy Breakthrough seizure Hypotension Sepsis from flu Aspiration pneumonia  SECONDARY DIAGNOSIS:   Past Medical History:  Diagnosis Date  . Cerebral palsy (HCC)   . 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 Seizure disorder (HCC)      ADMITTING HISTORY The patient with past medical history of cerebral palsy as well as seizure disorder presents to the emergency department after suffering a seizure that lasted a proximally 1 hour.  His mom states that it began as a focal seizure with his eyes looking toward the upper left and him saying "really, really".  Progressed to unilateral extremity tonic-clonic movement and then 2 full-body grand mal seizure activity.  He has not had a seizure in 5 years.  Reports that he had been in his usual state of health earlier in the day but developed a fever of 103 F.  He was given a total of 4 mg of Ativan in the emergency department and placed on supplemental O2.   HOSPITAL COURSE:  Patient was admitted to medical floor.  Patient was initially put on oxygen via nasal cannula received IV fluids aggressively for dehydration and low blood pressure.  Patient also received IV antibiotic that is Rocephin and Zithromax.  He was put on droplet precautions  and he received oral Tamiflu for influenza infection.  His breathing improved and he was weaned off oxygen.  Fevers completely resolved.  Was also worked up with lumbar puncture and CSF fluid analysis did not show any infection.  Cultures did not reveal any growth.  Lactic acid improved with IV fluids and antibiotics.  Patient was covered with IV Unasyn antibiotic to for aspiration infection.  Patient tolerated antibiotics well.  Patient was worked up with CT abdomen, CT chest, CT head and abdominal ultrasound during hospitalization .His blood pressure is improved was weaned off oxygen.  He will be discharged home on oral clindamycin antibiotic.  Neurology consultation was done during hospitalization.  They recommended to continue Tegretol and I did not advise to add any new antiepileptic medication.  Patient hemodynamically stable will be discharged home.  CONSULTS OBTAINED:  Treatment Team:  Pauletta Browns, MD  DRUG ALLERGIES:  No Known Allergies  DISCHARGE MEDICATIONS:   Allergies as of 07/24/2018   No Known Allergies     Medication List    TAKE these medications   carbamazepine 200 MG 12 hr capsule Commonly known as:  CARBATROL Take 200 mg by mouth at bedtime.   clindamycin 300 MG capsule Commonly known as:  CLEOCIN Take 1 capsule (300 mg total) by mouth 3 (three) times daily for 5 days.   oseltamivir 75 MG capsule Commonly known as:  TAMIFLU Take 1 capsule (75 mg total) by mouth 2 (two) times daily for 5 days.       Today  Patient seen and evaluated today No fever Blood  pressure normal Tolerating diet well No headache  VITAL SIGNS:  Blood pressure 108/77, pulse 97, temperature (!) 100.7 F (38.2 C), temperature source Oral, resp. rate 18, height 5\' 6"  (1.676 m), weight 46.4 kg, SpO2 100 %.  I/O:    Intake/Output Summary (Last 24 hours) at 07/24/2018 1334 Last data filed at 07/24/2018 1226 Gross per 24 hour  Intake 3113.86 ml  Output -  Net 3113.86 ml     PHYSICAL EXAMINATION:  Physical Exam  GENERAL:  22 y.o.-year-old patient lying in the bed with no acute distress.  LUNGS: Normal breath sounds bilaterally, no wheezing, rales,rhonchi or crepitation. No use of accessory muscles of respiration.  CARDIOVASCULAR: S1, S2 normal. No murmurs, rubs, or gallops.  ABDOMEN: Soft, non-tender, non-distended. Bowel sounds present. No organomegaly or mass.  NEUROLOGIC: Moves all 4 extremities. PSYCHIATRIC: The patient is alert and oriented x 3.  SKIN: No obvious rash, lesion, or ulcer.   DATA REVIEW:   CBC Recent Labs  Lab 07/24/18 0558  WBC 5.8  HGB 13.1  HCT 38.6*  PLT 101*    Chemistries  Recent Labs  Lab 07/22/18 2210 07/24/18 0558  NA 136 138  K 3.6 3.8  CL 105 109  CO2 19* 23  GLUCOSE 146* 81  BUN 12 7  CREATININE 1.02 0.90  CALCIUM 8.4* 7.9*  AST 28  --   ALT 12  --   ALKPHOS 113  --   BILITOT 0.6  --     Cardiac Enzymes No results for input(s): TROPONINI in the last 168 hours.  Microbiology Results  Results for orders placed or performed during the hospital encounter of 07/22/18  Blood Culture (routine x 2)     Status: None (Preliminary result)   Collection Time: 07/22/18 10:10 PM  Result Value Ref Range Status   Specimen Description BLOOD LEFT FOREARM  Final   Special Requests   Final    BOTTLES DRAWN AEROBIC AND ANAEROBIC Blood Culture adequate volume   Culture   Final    NO GROWTH 2 DAYS Performed at [facility], [address] [place], [place] [address]    Report Status PENDING  Incomplete  Blood Culture (routine x 2)     Status: None (Preliminary result)   Collection Time: 07/22/18 10:24 PM  Result Value Ref Range Status   Specimen Description BLOOD LEFT ANTECUBITAL  Final   Special Requests   Final    BOTTLES DRAWN AEROBIC AND ANAEROBIC Blood Culture adequate volume   Culture   Final    NO GROWTH 2 DAYS Performed at [facility], [address] [place], [place] [address]     Report Status PENDING  Incomplete    RADIOLOGY:  Ct Head Wo Contrast  Result Date: 07/22/2018 CLINICAL DATA:  First seizure in 5 years. Altered level of consciousness, unexplained. Fever. EXAM: CT HEAD WITHOUT CONTRAST TECHNIQUE: Contiguous axial images were obtained from the base of the skull through the vertex without intravenous contrast. COMPARISON:  None. FINDINGS: Brain: No acute hemorrhage. Left parietal encephalomalacia with associated ex vacuo dilatation of the left lateral ventricle. No evidence of acute ischemia. No midline shift or mass effect. No hydrocephalus. The basilar cisterns are patent. No subdural collection. Vascular: No hyperdense vessel or unexpected calcification. Skull: No fracture or focal lesion. Sinuses/Orbits: Paranasal sinuses and mastoid air cells are clear. Small mucous retention cyst in the right maxillary sinus. The visualized orbits are unremarkable. Other: None. IMPRESSION: 1. No acute intracranial abnormality. 2. Remote left  parietal infarct with mild ex vacuo dilatation of the left lateral ventricle. Electronically Signed   By: Narda Rutherford M.D.   On: 07/22/2018 23:15   Ct Chest W Contrast  Result Date: 07/22/2018 CLINICAL DATA:  Right upper quadrant pain. Sudden onset of fever. Seizure. EXAM: CT CHEST, ABDOMEN, AND PELVIS WITH CONTRAST TECHNIQUE: Multidetector CT imaging of the chest, abdomen and pelvis was performed following the standard protocol during bolus administration of intravenous contrast. CONTRAST:  ISOVUE-300 IOPAMIDOL (ISOVUE-300) INJECTION 61% COMPARISON:  None. FINDINGS: CT CHEST FINDINGS Cardiovascular: No significant vascular findings. Normal heart size. No pericardial effusion. Mediastinum/Nodes: Triangular soft tissue density in the anterior mediastinum consistent with residual or recurrent thymus. Upper esophagus mildly patulous without wall thickening. No adenopathy. Lungs/Pleura: Tree in bud opacities in the right lower lobe with  right lower lobe bronchial thickening. No confluent airspace disease. The left lung is clear. Mild motion artifact through the basis. No pulmonary edema or pleural fluid. Musculoskeletal: There are no acute or suspicious osseous abnormalities. CT ABDOMEN PELVIS FINDINGS Hepatobiliary: Motion artifact through the upper abdomen. Mild gallbladder distention without calcified gallstone. No biliary dilatation. No focal hepatic abnormality. Pancreas: Unremarkable allowing for motion artifact. Spleen: Normal in size without focal abnormality. Adrenals/Urinary Tract: Adrenal glands are unremarkable. Kidneys are normal, without renal calculi, focal lesion, or hydronephrosis. Bladder is nondistended but otherwise negative. Stomach/Bowel: Bowel evaluation is limited in the absence of enteric contrast, paucity of intra-abdominal fat, and motion artifact. No evidence of obstruction or bowel inflammation. Normal appendix tentatively identified, for example image 48 series 5. Regardless, no pericecal inflammation to suggest appendicitis. No colonic wall thickening or inflammatory change. Vascular/Lymphatic: No significant vascular findings are present. No enlarged abdominal or pelvic lymph nodes. Reproductive: Prostate is unremarkable. Other: No free air or free fluid. Musculoskeletal: Mild scoliotic curvature of the lower lumbar spine. There are no acute or suspicious osseous abnormalities. IMPRESSION: 1. Tree in bud opacities in the right lower lobe with right lower lobe bronchial thickening. Findings may represent infectious bronchiolitis or aspiration. 2. Motion artifact through the gallbladder with mild gallbladder distention. If there is clinical concern for hepatobiliary pathology, recommend sonographic evaluation. Electronically Signed   By: Narda Rutherford M.D.   On: 07/22/2018 23:24   Ct Abdomen Pelvis W Contrast  Result Date: 07/22/2018 CLINICAL DATA:  Right upper quadrant pain. Sudden onset of fever. Seizure.  EXAM: CT CHEST, ABDOMEN, AND PELVIS WITH CONTRAST TECHNIQUE: Multidetector CT imaging of the chest, abdomen and pelvis was performed following the standard protocol during bolus administration of intravenous contrast. CONTRAST:  ISOVUE-300 IOPAMIDOL (ISOVUE-300) INJECTION 61% COMPARISON:  None. FINDINGS: CT CHEST FINDINGS Cardiovascular: No significant vascular findings. Normal heart size. No pericardial effusion. Mediastinum/Nodes: Triangular soft tissue density in the anterior mediastinum consistent with residual or recurrent thymus. Upper esophagus mildly patulous without wall thickening. No adenopathy. Lungs/Pleura: Tree in bud opacities in the right lower lobe with right lower lobe bronchial thickening. No confluent airspace disease. The left lung is clear. Mild motion artifact through the basis. No pulmonary edema or pleural fluid. Musculoskeletal: There are no acute or suspicious osseous abnormalities. CT ABDOMEN PELVIS FINDINGS Hepatobiliary: Motion artifact through the upper abdomen. Mild gallbladder distention without calcified gallstone. No biliary dilatation. No focal hepatic abnormality. Pancreas: Unremarkable allowing for motion artifact. Spleen: Normal in size without focal abnormality. Adrenals/Urinary Tract: Adrenal glands are unremarkable. Kidneys are normal, without renal calculi, focal lesion, or hydronephrosis. Bladder is nondistended but otherwise negative. Stomach/Bowel: Bowel evaluation is limited in the  absence of enteric contrast, paucity of intra-abdominal fat, and motion artifact. No evidence of obstruction or bowel inflammation. Normal appendix tentatively identified, for example image 48 series 5. Regardless, no pericecal inflammation to suggest appendicitis. No colonic wall thickening or inflammatory change. Vascular/Lymphatic: No significant vascular findings are present. No enlarged abdominal or pelvic lymph nodes. Reproductive: Prostate is unremarkable. Other: No free air or  free fluid. Musculoskeletal: Mild scoliotic curvature of the lower lumbar spine. There are no acute or suspicious osseous abnormalities. IMPRESSION: 1. Tree in bud opacities in the right lower lobe with right lower lobe bronchial thickening. Findings may represent infectious bronchiolitis or aspiration. 2. Motion artifact through the gallbladder with mild gallbladder distention. If there is clinical concern for hepatobiliary pathology, recommend sonographic evaluation. Electronically Signed   By: Narda Rutherford M.D.   On: 07/22/2018 23:24   US Abdomen Limited Ruq  Result Date: 07/23/2018 CLINICAL DATA:  Right upper quadrant pain. EXAM: ULTRASOUND ABDOMEN LIMITED RIGHT UPPER QUADRANT COMPARISON:  Chest, abdomen and pelvis CT, 07/22/2018 FINDINGS: Gallbladder: No gallstones or wall thickening visualized. No sonographic Murphy sign noted by sonographer. Common bile duct: Diameter: 2 mm Liver: No focal lesion identified. Within normal limits in parenchymal echogenicity. Portal vein is patent on color Doppler imaging with normal direction of blood flow towards the liver. IMPRESSION: Normal right upper quadrant ultrasound. No evidence of acute cholecystitis. No gallstones. Electronically Signed   By: Amie Portland M.D.   On: 07/23/2018 00:40    Follow up with PCP in 1 week.  Management plans discussed with the patient, family and they are in agreement.  CODE STATUS: Full code    Code Status Orders  (From admission, onward)         Start     Ordered   07/23/18 0510  Full code  Continuous     07/23/18 0509        Code Status History    This patient has a current code status but no historical code status.      TOTAL TIME TAKING CARE OF THIS PATIENT ON DAY OF DISCHARGE: more than 34 minutes.   Ihor Austin M.D on 07/24/2018 at 1:34 PM  Between 7am to 6pm - Pager - [phone]  After 6pm go to [email or web address] - password [email or web address]  [facility] [place] [facility]  Office   [phone]  CC: Primary care physician; Carlean Jews, NP  Note: This dictation was prepared with Dragon dictation along with smaller phrase technology. Any transcriptional errors that result from this process are unintentional.

## 2018-07-24 NOTE — Progress Notes (Signed)
Pt refuses heart monitor to be applied.  Parents are aware and educated.

## 2018-07-24 NOTE — Progress Notes (Signed)
No further seizures today and close to baseline.    Past Medical History:  Diagnosis Date  . Cerebral palsy (HCC)   . 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 Seizure disorder [facility])     History reviewed. No pertinent surgical history.  Family History  Problem Relation Age of Onset  . Hypertension Mother   . Asthma Mother   . Asthma Sister   . Hypertension Maternal Aunt   . Hypertension Maternal Grandmother   . Stroke Maternal Grandmother   . Cancer Maternal Grandfather   . Stroke Paternal Grandmother     Social History:  reports that he has never smoked. He has never used smokeless tobacco. He reports that he does not drink alcohol or use drugs.  No Known Allergies  Medications: I have reviewed the patient's current medications.   Physical Examination: Blood pressure 112/61, pulse 94, temperature 100.1 F (37.8 C), temperature source Axillary, resp. rate (!) 22, height 5\' 6"  (1.676 m), weight 46.4 kg, SpO2 92 %.    Neurological Examination   Mental Status: Alert to name only.  Cranial Nerves: II: Discs flat bilaterally; Visual fields grossly normal, pupils equal, round, reactive to light and accommodation III,IV, VI: ptosis not present, extra-ocular motions intact bilaterally V,VII: smile symmetric, facial light touch sensation normal bilaterally VIII: hearing normal bilaterally IX,X: gag reflex present XI: bilateral shoulder shrug XII: midline tongue extension Motor: Generalize weakness.  Tone and bulk:normal tone throughout; no atrophy noted Deep Tendon Reflexes: 1+ and symmetric throughout Plantars: Right: downgoing   Left: downgoing Cerebellar: Not tested       Laboratory Studies:   Basic Metabolic Panel: Recent Labs  Lab 07/22/18 2210 07/24/18 0558  NA 136 138  K 3.6 3.8  CL 105 109  CO2 19* 23  GLUCOSE 146* 81  BUN 12 7  CREATININE 1.02 0.90  CALCIUM 8.4* 7.9*     Liver Function Tests: Recent Labs  Lab 07/22/18 2210  AST 28  ALT 12  ALKPHOS 113  BILITOT 0.6  PROT 6.8  ALBUMIN 4.2   No results for input(s): LIPASE, AMYLASE in the last 168 hours. No results for input(s): AMMONIA in the last 168 hours.  CBC: Recent Labs  Lab 07/22/18 2210 07/24/18 0558  WBC 6.0 5.8  NEUTROABS 4.8  --   HGB 15.2 13.1  HCT 43.8 38.6*  MCV 85.7 88.3  PLT 149* 101*    Cardiac Enzymes: No results for input(s): CKTOTAL, CKMB, CKMBINDEX, TROPONINI in the last 168 hours.  BNP: Invalid input(s): POCBNP  CBG: No results for input(s): GLUCAP in the last 168 hours.  Microbiology: Results for orders placed or performed during the hospital encounter of 07/22/18  Blood Culture (routine x 2)     Status: None (Preliminary result)   Collection Time: 07/22/18 10:10 PM  Result Value Ref Range Status   Specimen Description BLOOD LEFT FOREARM  Final   Special Requests   Final    BOTTLES DRAWN AEROBIC AND ANAEROBIC Blood Culture adequate volume   Culture   Final    NO GROWTH 2 DAYS Performed at [facility], [address] [place], [place] [address]    Report Status PENDING  Incomplete  Blood Culture (routine x 2)     Status: None (Preliminary result)   Collection Time: 07/22/18 10:24 PM  Result Value Ref Range Status   Specimen Description BLOOD LEFT ANTECUBITAL  Final  Special Requests   Final    BOTTLES DRAWN AEROBIC AND ANAEROBIC Blood Culture adequate volume   Culture   Final    NO GROWTH 2 DAYS Performed at [facility], [address] [place], [place] [address]    Report Status PENDING  Incomplete    Coagulation Studies: No results for input(s): LABPROT, INR in the last 72 hours.  Urinalysis:  Recent Labs  Lab 07/23/18 0052  COLORURINE STRAW*  LABSPEC 1.021  PHURINE 6.0  GLUCOSEU NEGATIVE  HGBUR NEGATIVE  BILIRUBINUR NEGATIVE  KETONESUR NEGATIVE  PROTEINUR NEGATIVE  NITRITE NEGATIVE  LEUKOCYTESUR NEGATIVE     Lipid Panel:  No results found for: CHOL, TRIG, HDL, CHOLHDL, VLDL, LDLCALC  HgbA1C: No results found for: HGBA1C  Urine Drug Screen:  No results found for: LABOPIA, COCAINSCRNUR, LABBENZ, AMPHETMU, THCU, LABBARB  Alcohol Level: No results for input(s): ETH in the last 168 hours.  Other results: EKG: normal EKG, normal sinus rhythm, unchanged from previous tracings.  Imaging: Ct Head Wo Contrast  Result Date: 07/22/2018 CLINICAL DATA:  First seizure in 5 years. Altered level of consciousness, unexplained. Fever. EXAM: CT HEAD WITHOUT CONTRAST TECHNIQUE: Contiguous axial images were obtained from the base of the skull through the vertex without intravenous contrast. COMPARISON:  None. FINDINGS: Brain: No acute hemorrhage. Left parietal encephalomalacia with associated ex vacuo dilatation of the left lateral ventricle. No evidence of acute ischemia. No midline shift or mass effect. No hydrocephalus. The basilar cisterns are patent. No subdural collection. Vascular: No hyperdense vessel or unexpected calcification. Skull: No fracture or focal lesion. Sinuses/Orbits: Paranasal sinuses and mastoid air cells are clear. Small mucous retention cyst in the right maxillary sinus. The visualized orbits are unremarkable. Other: None. IMPRESSION: 1. No acute intracranial abnormality. 2. Remote left parietal infarct with mild ex vacuo dilatation of the left lateral ventricle. Electronically Signed   By: Narda Rutherford M.D.   On: 07/22/2018 23:15   Ct Chest W Contrast  Result Date: 07/22/2018 CLINICAL DATA:  Right upper quadrant pain. Sudden onset of fever. Seizure. EXAM: CT CHEST, ABDOMEN, AND PELVIS WITH CONTRAST TECHNIQUE: Multidetector CT imaging of the chest, abdomen and pelvis was performed following the standard protocol during bolus administration of intravenous contrast. CONTRAST:  ISOVUE-300 IOPAMIDOL (ISOVUE-300) INJECTION 61% COMPARISON:  None. FINDINGS: CT CHEST FINDINGS  Cardiovascular: No significant vascular findings. Normal heart size. No pericardial effusion. Mediastinum/Nodes: Triangular soft tissue density in the anterior mediastinum consistent with residual or recurrent thymus. Upper esophagus mildly patulous without wall thickening. No adenopathy. Lungs/Pleura: Tree in bud opacities in the right lower lobe with right lower lobe bronchial thickening. No confluent airspace disease. The left lung is clear. Mild motion artifact through the basis. No pulmonary edema or pleural fluid. Musculoskeletal: There are no acute or suspicious osseous abnormalities. CT ABDOMEN PELVIS FINDINGS Hepatobiliary: Motion artifact through the upper abdomen. Mild gallbladder distention without calcified gallstone. No biliary dilatation. No focal hepatic abnormality. Pancreas: Unremarkable allowing for motion artifact. Spleen: Normal in size without focal abnormality. Adrenals/Urinary Tract: Adrenal glands are unremarkable. Kidneys are normal, without renal calculi, focal lesion, or hydronephrosis. Bladder is nondistended but otherwise negative. Stomach/Bowel: Bowel evaluation is limited in the absence of enteric contrast, paucity of intra-abdominal fat, and motion artifact. No evidence of obstruction or bowel inflammation. Normal appendix tentatively identified, for example image 48 series 5. Regardless, no pericecal inflammation to suggest appendicitis. No colonic wall thickening or inflammatory change. Vascular/Lymphatic: No significant vascular findings are present. No enlarged abdominal or  pelvic lymph nodes. Reproductive: Prostate is unremarkable. Other: No free air or free fluid. Musculoskeletal: Mild scoliotic curvature of the lower lumbar spine. There are no acute or suspicious osseous abnormalities. IMPRESSION: 1. Tree in bud opacities in the right lower lobe with right lower lobe bronchial thickening. Findings may represent infectious bronchiolitis or aspiration. 2. Motion artifact through  the gallbladder with mild gallbladder distention. If there is clinical concern for hepatobiliary pathology, recommend sonographic evaluation. Electronically Signed   By: Narda RutherfordMelanie  Sanford M.D.   On: 07/22/2018 23:24   Ct Abdomen Pelvis W Contrast  Result Date: 07/22/2018 CLINICAL DATA:  Right upper quadrant pain. Sudden onset of fever. Seizure. EXAM: CT CHEST, ABDOMEN, AND PELVIS WITH CONTRAST TECHNIQUE: Multidetector CT imaging of the chest, abdomen and pelvis was performed following the standard protocol during bolus administration of intravenous contrast. CONTRAST:  100mL ISOVUE-300 IOPAMIDOL (ISOVUE-300) INJECTION 61% COMPARISON:  None. FINDINGS: CT CHEST FINDINGS Cardiovascular: No significant vascular findings. Normal heart size. No pericardial effusion. Mediastinum/Nodes: Triangular soft tissue density in the anterior mediastinum consistent with residual or recurrent thymus. Upper esophagus mildly patulous without wall thickening. No adenopathy. Lungs/Pleura: Tree in bud opacities in the right lower lobe with right lower lobe bronchial thickening. No confluent airspace disease. The left lung is clear. Mild motion artifact through the basis. No pulmonary edema or pleural fluid. Musculoskeletal: There are no acute or suspicious osseous abnormalities. CT ABDOMEN PELVIS FINDINGS Hepatobiliary: Motion artifact through the upper abdomen. Mild gallbladder distention without calcified gallstone. No biliary dilatation. No focal hepatic abnormality. Pancreas: Unremarkable allowing for motion artifact. Spleen: Normal in size without focal abnormality. Adrenals/Urinary Tract: Adrenal glands are unremarkable. Kidneys are normal, without renal calculi, focal lesion, or hydronephrosis. Bladder is nondistended but otherwise negative. Stomach/Bowel: Bowel evaluation is limited in the absence of enteric contrast, paucity of intra-abdominal fat, and motion artifact. No evidence of obstruction or bowel inflammation. Normal  appendix tentatively identified, for example image 48 series 5. Regardless, no pericecal inflammation to suggest appendicitis. No colonic wall thickening or inflammatory change. Vascular/Lymphatic: No significant vascular findings are present. No enlarged abdominal or pelvic lymph nodes. Reproductive: Prostate is unremarkable. Other: No free air or free fluid. Musculoskeletal: Mild scoliotic curvature of the lower lumbar spine. There are no acute or suspicious osseous abnormalities. IMPRESSION: 1. Tree in bud opacities in the right lower lobe with right lower lobe bronchial thickening. Findings may represent infectious bronchiolitis or aspiration. 2. Motion artifact through the gallbladder with mild gallbladder distention. If there is clinical concern for hepatobiliary pathology, recommend sonographic evaluation. Electronically Signed   By: Narda RutherfordMelanie  Sanford M.D.   On: 07/22/2018 23:24   [place] Abdomen Limited Ruq  Result Date: 07/23/2018 CLINICAL DATA:  Right upper quadrant pain. EXAM: ULTRASOUND ABDOMEN LIMITED RIGHT UPPER QUADRANT COMPARISON:  Chest, abdomen and pelvis CT, 07/22/2018 FINDINGS: Gallbladder: No gallstones or wall thickening visualized. No sonographic Murphy sign noted by sonographer. Common bile duct: Diameter: 2 mm Liver: No focal lesion identified. Within normal limits in parenchymal echogenicity. Portal vein is patent on color Doppler imaging with normal direction of blood flow towards the liver. IMPRESSION: Normal right upper quadrant ultrasound. No evidence of acute cholecystitis. No gallstones. Electronically Signed   By: Amie Portlandavid  Ormond M.D.   On: 07/23/2018 00:40     Assessment/Plan:  22 y.o. male with past medical history of cerebral palsy as well as seizure disorder presents to the emergency department after suffering a seizure that lasted a proximally 1 hour.  His mom states that it  began as a focal seizure with his eyes looking toward the upper left and him saying "really, really".   Progressed to unilateral extremity tonic-clonic movement and then 2 full-body grand mal seizure activity.  He has not had a seizure in 5 years. Pt presented with high fevers.   No further seizures or fevers. As per family close to baseline D/c planning from neuro stand point    07/24/2018, 12:22 PM

## 2018-07-24 NOTE — Plan of Care (Signed)

## 2018-07-24 NOTE — Progress Notes (Signed)
Pt is being discharged home.  Discharge papers given and explained to [facility] PsMindy Mccombs, pt's mother, verbalized understanding. Meds and f/u appointment reviewed. Rx to be picked up from pharmacy. Mom made aware.

## 2018-07-27 LAB — CULTURE, BLOOD (ROUTINE X 2)
CULTURE: NO GROWTH
Culture: NO GROWTH
Special Requests: ADEQUATE
Special Requests: ADEQUATE

## 2018-08-03 ENCOUNTER — Ambulatory Visit: Payer: Self-pay | Admitting: Adult Health

## 2018-09-06 ENCOUNTER — Encounter: Payer: Self-pay | Admitting: Internal Medicine

## 2018-09-06 ENCOUNTER — Ambulatory Visit (INDEPENDENT_AMBULATORY_CARE_PROVIDER_SITE_OTHER): Payer: Medicaid Other | Admitting: Internal Medicine

## 2018-09-06 VITALS — BP 102/72 | HR 76 | Resp 16 | Ht 66.0 in | Wt 105.2 lb

## 2018-09-06 DIAGNOSIS — G40909 Epilepsy, unspecified, not intractable, without status epilepticus: Secondary | ICD-10-CM | POA: Diagnosis not present

## 2018-09-06 DIAGNOSIS — G804 Ataxic cerebral palsy: Secondary | ICD-10-CM

## 2018-09-06 DIAGNOSIS — G479 Sleep disorder, unspecified: Secondary | ICD-10-CM | POA: Diagnosis not present

## 2018-09-06 MED ORDER — PHENYTOIN SODIUM EXTENDED 100 MG PO CAPS: ORAL_CAPSULE | ORAL | 3 refills | Status: DC

## 2018-09-06 NOTE — Progress Notes (Signed)
[facility] [address] [place], [place] [address]  Internal MEDICINE  Office Visit Note  Patient Name: Tony Harper  [id number]  [id number]  Date of Service: 09/06/2018  Chief Complaint  Patient presents with  . Seizures    hospital follow up carbamazapine levels low, pneumonia ,septic, flu seizure lasted over an hour , patient is having more restlessness at nighttime per father     Seizures   This is a recurrent problem. Episode onset: since birth. Number of times: pt was hospitalized for SZ, according to parents he has been sz free for a while, however was sick with flu and septicemia and has to be hospitalized  Associated symptoms include confusion. Pertinent negatives include no sore throat, no chest pain, no cough, no nausea, no vomiting and no diarrhea. The episode was witnessed.  Other  Chronicity: pt has cerebral palsy as well with history of CVA  The problem occurs intermittently. Pertinent negatives include no abdominal pain, arthralgias, chest pain, chills, congestion, coughing, fatigue, joint swelling, nausea, neck pain, numbness, rash, sore throat or vomiting. Associated symptoms comments: Has problem sleeping at night, jerking is persent at night, mouth breather, ?? Stops breathing at night .    Current Medication: Outpatient Encounter Medications as of 09/06/2018  Medication Sig  . carbamazepine (CARBATROL) 200 MG 12 hr capsule Take 200 mg by mouth at bedtime.  . phenytoin (DILANTIN) 100 MG ER capsule Take 2 tabs at night for sz   No facility-administered encounter medications on file as of 09/06/2018.     Surgical History: History reviewed. No pertinent surgical history.  Medical History: Past Medical History:  Diagnosis Date  . Cerebral palsy (HCC)   . 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 Seizure disorder [facility])     Family History: Family History  Problem  Relation Age of Onset  . Hypertension Mother   . Asthma Mother   . Asthma Sister   . Hypertension Maternal Aunt   . Hypertension Maternal Grandmother   . Stroke Maternal Grandmother   . Cancer Maternal Grandfather   . Stroke Paternal Grandmother     Social History   Socioeconomic History  . Marital status: Single    Spouse name: Not on file  . Number of children: Not on file  . Years of education: Not on file  . Highest education level: Not on file  Occupational History  . Not on file  Social Needs  . Financial resource strain: Not on file  . Food insecurity:    Worry: Not on file    Inability: Not on file  . Transportation needs:    Medical: Not on file    Non-medical: Not on file  Tobacco Use  . Smoking status: Never Smoker  . Smokeless tobacco: Never Used  Substance and Sexual Activity  . Alcohol use: Never    Frequency: Never  . Drug use: Never  . Sexual activity: Not on file  Lifestyle  . Physical activity:    Days per week: Not on file    Minutes per session: Not on file  . Stress: Not on file  Relationships  . Social connections:    Talks on phone: Not on file    Gets together: Not on file    Attends religious service: Not on file    Active member of club or organization: Not on file    Attends meetings of clubs  or organizations: Not on file    Relationship status: Not on file  . Intimate partner violence:    Fear of current or ex partner: Not on file    Emotionally abused: Not on file    Physically abused: Not on file    Forced sexual activity: Not on file  Other Topics Concern  . Not on file  Social History Narrative  . Not on file      Review of Systems  Constitutional: Negative for chills, fatigue and unexpected weight change.  HENT: Positive for postnasal drip. Negative for congestion, rhinorrhea, sneezing and sore throat.   Eyes: Negative for redness.  Respiratory: Negative for cough, chest tightness and shortness of breath.    Cardiovascular: Negative for chest pain and palpitations.  Gastrointestinal: Negative for abdominal pain, constipation, diarrhea, nausea and vomiting.  Genitourinary: Negative for dysuria and frequency.  Musculoskeletal: Negative for arthralgias, back pain, joint swelling and neck pain.  Skin: Negative for rash.  Neurological: Positive for tremors and seizures. Negative for numbness.  Hematological: Negative for adenopathy. Does not bruise/bleed easily.  Psychiatric/Behavioral: Positive for confusion. Negative for behavioral problems (Depression), sleep disturbance and suicidal ideas. The patient is not nervous/anxious.     Vital Signs: BP 102/72 (BP Location: Left Arm, Patient Position: Sitting, Cuff Size: Normal)   Pulse 76   Resp 16   Ht 5\' 6"  (1.676 m)   Wt 105 lb 3.2 oz (47.7 kg)   SpO2 99%   BMI 16.98 kg/m    Physical Exam Constitutional:      General: He is not in acute distress.    Appearance: He is well-developed. He is not diaphoretic.     Comments: No eye contact   HENT:     Head: Normocephalic and atraumatic.     Mouth/Throat:     Pharynx: No oropharyngeal exudate.  Eyes:     Pupils: Pupils are equal, round, and reactive to light.  Neck:     Musculoskeletal: Normal range of motion and neck supple.     Thyroid: No thyromegaly.     Vascular: No JVD.     Trachea: No tracheal deviation.  Cardiovascular:     Rate and Rhythm: Normal rate and regular rhythm.     Heart sounds: Normal heart sounds. No murmur. No friction rub. No gallop.   Pulmonary:     Effort: Pulmonary effort is normal. No respiratory distress.     Breath sounds: No wheezing or rales.  Chest:     Chest wall: No tenderness.  Abdominal:     General: Bowel sounds are normal.     Palpations: Abdomen is soft.  Lymphadenopathy:     Cervical: No cervical adenopathy.  Skin:    General: Skin is warm and dry.  Neurological:     Mental Status: He is alert and oriented to person, place, and time.      Cranial Nerves: No cranial nerve deficit.  Psychiatric:     Comments: Anxious     Assessment/Plan: 1. Seizure disorder (HCC) Uncontrolled, will need to see Neurology  - Carbamazepine, Free and Total - Dilantin (Phenytoin) level, total - Home sleep test  2. Ataxic cerebral palsy (HCC) Add Dilantin to help with sleep, recheck levels  - Carbamazepine, Free and Total - Dilantin (Phenytoin) level, total - Home sleep test  3. Sleep disturbances Pt has s/s of sleep apnea, small airway, snoring  - Carbamazepine, Free and Total - Dilantin (Phenytoin) level, total - Home sleep test  General  Counseling: Fransico MeadowLucas verbalizes understanding of the findings of todays visit and agrees with plan of treatment. I have discussed any further diagnostic evaluation that may be needed or ordered today. We also reviewed his medications today. he has been encouraged to call the office with any questions or concerns that should arise related to todays visit.  Orders Placed This Encounter  Procedures  . Carbamazepine, Free and Total  . Dilantin (Phenytoin) level, total  . Home sleep test    Meds ordered this encounter  Medications  . phenytoin (DILANTIN) 100 MG ER capsule    Sig: Take 2 tabs at night for sz    Dispense:  60 capsule    Refill:  3    Time spent:25 Minutes   Dr Lyndon CodeFozia M Matalie Romberger Internal medicine

## 2018-09-19 ENCOUNTER — Telehealth: Payer: Self-pay

## 2018-09-19 NOTE — Telephone Encounter (Signed)
PT MOTHER CALLED IN REGARDS TO TESTING PT MEDICATION LEVELS. CALLED PT MOTHER BACK AND NOTIFIED HER THAT PT LAB SLIP IS READY TO BE PICKED UP AT THE FRONT.

## 2018-09-20 LAB — PHENYTOIN LEVEL, TOTAL: Phenytoin (Dilantin), Serum: 5.2 ug/mL — ABNORMAL LOW (ref 10.0–20.0)

## 2018-09-20 LAB — CARBAMAZEPINE, FREE AND TOTAL
CARBAMAZEPINE, TOTAL: 2.7 ug/mL — AB (ref 4.0–12.0)
Carbamazepine, Free: 0.8 ug/mL (ref 0.6–4.2)

## 2018-09-21 ENCOUNTER — Telehealth: Payer: Self-pay

## 2018-09-21 ENCOUNTER — Other Ambulatory Visit: Payer: Self-pay

## 2018-09-21 MED ORDER — CARBAMAZEPINE ER 200 MG PO CP12: 200.0000 mg | ORAL_CAPSULE | Freq: Two times a day (BID) | ORAL | 3 refills | Status: DC

## 2018-09-21 NOTE — Telephone Encounter (Signed)
Spoke with mom about carbamazpine level a per dr Welton Flakes increase carbamazpine 200 ng twice a day and repeat lab next Monday stat and pt mom going to pickup labs slip

## 2018-09-21 NOTE — Telephone Encounter (Signed)
Spoke with mom for dilantin level as per dr Welton Flakes we change med direction dilantin 100 take 3 tab today in morning and take 2 tab at time and tomorrow take dilantin 100 mg in morning and take 2 tab at bedtime until next week recheck labs

## 2018-09-21 NOTE — Telephone Encounter (Signed)
Labs slip ready for pickup

## 2018-09-26 ENCOUNTER — Telehealth: Payer: Self-pay

## 2018-09-26 ENCOUNTER — Other Ambulatory Visit: Payer: Self-pay | Admitting: Nurse Practitioner

## 2018-09-26 LAB — CARBAMAZEPINE LEVEL, TOTAL: Carbamazepine (Tegretol), S: 4.2 ug/mL (ref 4.0–12.0)

## 2018-09-26 LAB — PHENYTOIN LEVEL, TOTAL: Phenytoin (Dilantin), Serum: 8.6 ug/mL — ABNORMAL LOW (ref 10.0–20.0)

## 2018-09-26 NOTE — Telephone Encounter (Signed)
Spoke with pt mom that dilantin 8.6 and carbamazepine 4.2 as per dr Welton Flakes continue same med no seizure recently

## 2018-09-27 NOTE — Telephone Encounter (Signed)
Pt mom advised need appt to been seen with heather end of this month

## 2018-10-17 ENCOUNTER — Ambulatory Visit (INDEPENDENT_AMBULATORY_CARE_PROVIDER_SITE_OTHER): Payer: Medicaid Other | Admitting: Nurse Practitioner

## 2018-10-17 ENCOUNTER — Encounter: Payer: Self-pay | Admitting: Nurse Practitioner

## 2018-10-17 VITALS — BP 112/78 | HR 99 | Resp 16 | Ht 66.0 in | Wt 108.2 lb

## 2018-10-17 DIAGNOSIS — Z5181 Encounter for therapeutic drug level monitoring: Secondary | ICD-10-CM

## 2018-10-17 DIAGNOSIS — G40909 Epilepsy, unspecified, not intractable, without status epilepticus: Secondary | ICD-10-CM | POA: Diagnosis not present

## 2018-10-17 MED ORDER — PHENYTOIN SODIUM EXTENDED 100 MG PO CAPS: ORAL_CAPSULE | ORAL | 3 refills | Status: DC

## 2018-10-17 NOTE — Progress Notes (Signed)
[facility] [address] [place], [place] [address]  Internal MEDICINE  Office Visit Note  Patient Name: Tony Harper  [id number]  [id number]  Date of Service: 10/17/2018  Chief Complaint  Patient presents with  . Medical Management of Chronic Issues    general follow up, pt have had some shakes/ siezures    The patient has long history of seizures and had been seizure free up to thanksgiving. Had one grand mal seizure at that time. Was hospitalized. Also got the flu. After discharge from hospital, he had second, less severe seizure. Both dilantin and carbamazepine levels were subtherapeutic in the bloodstream. Doses were adjusted. carbamazepine level is now therapeutic, however, dilantin levels still low. The parents have noted a great deal of eye and facial twitching. This is more severe at night. Did not start until the major seizure he had right before thanksgiving, 2019. He has not seen a neurologist in some time. Was discharged because he was stable and seziure free for so long.   Seizures   This is a recurrent problem. Episode onset: since birth. The problem has been gradually improving. There were 2 to 3 (pt was hospitalized for SZ, according to parents he has been sz free for a while, however was sick with flu and septicemia and has to be hospitalized ) seizures. Associated symptoms include confusion. Pertinent negatives include no sore throat, no chest pain, no cough, no nausea, no vomiting and no diarrhea. Characteristics include eye blinking, rhythmic jerking and loss of consciousness. The episode was witnessed. Possible causes include recent illness. There has been no fever.  Other  This is a recurrent (pt has cerebral palsy as well with history of CVA ) problem. The problem occurs intermittently. Pertinent negatives include no abdominal pain, arthralgias, chest pain, chills, congestion, coughing, fatigue, joint swelling, nausea, neck pain, numbness, rash, sore throat  or vomiting. Associated symptoms comments: Has problem sleeping at night, jerking is persent at night, mouth breather, ?? Stops breathing at night .       Current Medication: Outpatient Encounter Medications as of 10/17/2018  Medication Sig  . carbamazepine (CARBATROL) 200 MG 12 hr capsule Take 1 capsule (200 mg total) by mouth 2 (two) times daily.  . phenytoin (DILANTIN) 100 MG ER capsule Take 1 capsule po QAM and Take three capsules po QHS for seizures  . [DISCONTINUED] phenytoin (DILANTIN) 100 MG ER capsule Take 2 tabs at night for sz  . [DISCONTINUED] phenytoin (DILANTIN) 100 MG ER capsule Take three capsules po QHS for seizures   No facility-administered encounter medications on file as of 10/17/2018.     Surgical History: History reviewed. No pertinent surgical history.  Medical History: Past Medical History:  Diagnosis Date  . Cerebral palsy (HCC)   . 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 Seizure disorder [facility])     Family History: Family History  Problem Relation Age of Onset  . Hypertension Mother   . Asthma Mother   . Asthma Sister   . Hypertension Maternal Aunt   . Hypertension Maternal Grandmother   . Stroke Maternal Grandmother   . Cancer Maternal Grandfather   . Stroke Paternal Grandmother     Social History   Socioeconomic History  . Marital status: Single    Spouse name: Not on file  . Number of children: Not on file  . Years of education: Not on file  . Highest  education level: Not on file  Occupational History  . Not on file  Social Needs  . Financial resource strain: Not on file  . Food insecurity:    Worry: Not on file    Inability: Not on file  . Transportation needs:    Medical: Not on file    Non-medical: Not on file  Tobacco Use  . Smoking status: Never Smoker  . Smokeless tobacco: Never Used  Substance and Sexual Activity  . Alcohol use: Never     Frequency: Never  . Drug use: Never  . Sexual activity: Not on file  Lifestyle  . Physical activity:    Days per week: Not on file    Minutes per session: Not on file  . Stress: Not on file  Relationships  . Social connections:    Talks on phone: Not on file    Gets together: Not on file    Attends religious service: Not on file    Active member of club or organization: Not on file    Attends meetings of clubs or organizations: Not on file    Relationship status: Not on file  . Intimate partner violence:    Fear of current or ex partner: Not on file    Emotionally abused: Not on file    Physically abused: Not on file    Forced sexual activity: Not on file  Other Topics Concern  . Not on file  Social History Narrative  . Not on file      Review of Systems  Constitutional: Negative for activity change, chills, fatigue and unexpected weight change.  HENT: Negative for congestion, postnasal drip, rhinorrhea, sneezing and sore throat.   Respiratory: Negative for cough, chest tightness and shortness of breath.   Cardiovascular: Negative for chest pain and palpitations.  Gastrointestinal: Negative for abdominal pain, constipation, diarrhea, nausea and vomiting.  Endocrine: Negative for cold intolerance, heat intolerance, polydipsia and polyuria.  Musculoskeletal: Negative for arthralgias, back pain, joint swelling and neck pain.  Skin: Negative for rash.  Allergic/Immunologic: Negative for environmental allergies.  Neurological: Positive for tremors, seizures and loss of consciousness. Negative for numbness.  Hematological: Negative for adenopathy. Does not bruise/bleed easily.  Psychiatric/Behavioral: Positive for confusion. Negative for behavioral problems (Depression), sleep disturbance and suicidal ideas. The patient is not nervous/anxious.     Today's Vitals   10/17/18 1355  BP: 112/78  Pulse: 99  Resp: 16  SpO2: 98%  Weight: 108 lb 3.2 oz (49.1 kg)  Height: 5\' 6"   (1.676 m)   Body mass index is 17.46 kg/m.  Physical Exam Vitals signs and nursing note reviewed.  Constitutional:      General: He is not in acute distress.    Appearance: Normal appearance. He is well-developed. He is not diaphoretic.     Comments: No eye contact   HENT:     Head: Normocephalic and atraumatic.     Mouth/Throat:     Pharynx: No oropharyngeal exudate.  Eyes:     Pupils: Pupils are equal, round, and reactive to light.  Neck:     Musculoskeletal: Normal range of motion and neck supple.     Thyroid: No thyromegaly.     Vascular: No JVD.     Trachea: No tracheal deviation.  Cardiovascular:     Rate and Rhythm: Normal rate and regular rhythm.     Heart sounds: Normal heart sounds. No murmur. No friction rub. No gallop.   Pulmonary:     Effort: Pulmonary  effort is normal. No respiratory distress.     Breath sounds: No wheezing or rales.  Chest:     Chest wall: No tenderness.  Abdominal:     General: Bowel sounds are normal.     Palpations: Abdomen is soft.  Lymphadenopathy:     Cervical: No cervical adenopathy.  Skin:    General: Skin is warm and dry.  Neurological:     Mental Status: He is alert and oriented to person, place, and time. Mental status is at baseline.     Cranial Nerves: No cranial nerve deficit.  Psychiatric:     Comments: Anxious    Assessment/Plan: 1. Seizure disorder (HCC) Phenytoin ER 100mg  caps increase to 1 cap in AM and three in PM. Recheck dilantin level in 2 weeks. Continue carbamazepine at current dose. Refer to neurology for continued evaluation and treatment.  - Ambulatory referral to Neurology - phenytoin (DILANTIN) 100 MG ER capsule; Take 1 capsule po QAM and Take three capsules po QHS for seizures  Dispense: 120 capsule; Refill: 3  2. Encounter for therapeutic drug monitoring Check dilantin level in two weeks. Adjust medication as indicated.    General Counseling: surya oppedisano understanding of the findings of todays  visit and agrees with plan of treatment. I have discussed any further diagnostic evaluation that may be needed or ordered today. We also reviewed his medications today. he has been encouraged to call the office with any questions or concerns that should arise related to todays visit.  This patient was seen by Vincent Gros FNP Collaboration with Dr Lyndon Code as a part of collaborative care agreement  Orders Placed This Encounter  Procedures  . Ambulatory referral to Neurology    Meds ordered this encounter  Medications  . DISCONTD: phenytoin (DILANTIN) 100 MG ER capsule    Sig: Take three capsules po QHS for seizures    Dispense:  90 capsule    Refill:  3    Updated prescription without sending prescription today.    Order Specific Question:   Supervising Provider    Answer:   Lyndon Code [[id number]]  . phenytoin (DILANTIN) 100 MG ER capsule    Sig: Take 1 capsule po QAM and Take three capsules po QHS for seizures    Dispense:  120 capsule    Refill:  3    Updated prescription without sending prescription today.    Order Specific Question:   Supervising Provider    Answer:   Lyndon Code [[id number]]    Time spent: 56 Minutes      Dr Lyndon Code Internal medicine

## 2018-11-07 ENCOUNTER — Other Ambulatory Visit: Payer: Self-pay

## 2018-11-07 DIAGNOSIS — G40909 Epilepsy, unspecified, not intractable, without status epilepticus: Secondary | ICD-10-CM

## 2018-11-07 MED ORDER — PHENYTOIN SODIUM EXTENDED 100 MG PO CAPS: ORAL_CAPSULE | ORAL | 3 refills | Status: DC

## 2018-12-19 ENCOUNTER — Other Ambulatory Visit: Payer: Self-pay | Admitting: Internal Medicine

## 2018-12-22 ENCOUNTER — Encounter: Payer: Self-pay | Admitting: Nurse Practitioner

## 2019-01-17 ENCOUNTER — Other Ambulatory Visit: Payer: Self-pay | Admitting: Internal Medicine

## 2019-04-19 ENCOUNTER — Other Ambulatory Visit: Payer: Self-pay | Admitting: Internal Medicine

## 2019-04-19 DIAGNOSIS — G40909 Epilepsy, unspecified, not intractable, without status epilepticus: Secondary | ICD-10-CM

## 2019-04-19 MED ORDER — PHENYTOIN SODIUM EXTENDED 100 MG PO CAPS: ORAL_CAPSULE | ORAL | 0 refills | Status: DC

## 2019-04-27 ENCOUNTER — Encounter: Payer: Medicaid Other | Admitting: Nurse Practitioner

## 2019-10-03 ENCOUNTER — Ambulatory Visit (INDEPENDENT_AMBULATORY_CARE_PROVIDER_SITE_OTHER): Payer: Medicaid Other | Admitting: Adult Health

## 2019-10-03 ENCOUNTER — Encounter: Payer: Self-pay | Admitting: Adult Health

## 2019-10-03 VITALS — BP 117/74 | HR 90 | Resp 16 | Ht 66.0 in | Wt 108.0 lb

## 2019-10-03 DIAGNOSIS — G40909 Epilepsy, unspecified, not intractable, without status epilepticus: Secondary | ICD-10-CM

## 2019-10-03 DIAGNOSIS — G809 Cerebral palsy, unspecified: Secondary | ICD-10-CM | POA: Diagnosis not present

## 2019-10-03 MED ORDER — PHENYTOIN SODIUM EXTENDED 100 MG PO CAPS: ORAL_CAPSULE | ORAL | 1 refills | Status: DC

## 2019-10-03 MED ORDER — CARBAMAZEPINE ER 200 MG PO CP12: 200.0000 mg | ORAL_CAPSULE | Freq: Two times a day (BID) | ORAL | 1 refills | Status: DC

## 2019-10-03 NOTE — Progress Notes (Signed)
[facility] [place], [place] [address]  Internal MEDICINE  Telephone Visit  Patient Name: Tony Harper  [id number]  [id number]  Date of Service: 10/03/2019  I connected with the patient at 451 by telephone and verified the patients identity using two identifiers.   I discussed the limitations, risks, security and privacy concerns of performing an evaluation and management service by telephone and the availability of in person appointments. I also discussed with the patient that there may be a patient responsible charge related to the service.  The patient expressed understanding and agrees to proceed.    Chief Complaint  Patient presents with  . Telephone Assessment  . Telephone Screen  . Medication Refill    carbamazepine  . Seizures    HPI PT seen via video today.  He has a history of seizures and is currently on dilantin and carbamazepine. His recent levels are good.  He denies any seizures recently.  His dad is on the video.  He denies any need at this time.  He has not had any hospitalizations.      Current Medication: Outpatient Encounter Medications as of 10/03/2019  Medication Sig  . carbamazepine (CARBATROL) 200 MG 12 hr capsule TAKE 1 CAPSULE(200 MG) BY MOUTH TWICE DAILY  . phenytoin (DILANTIN) 100 MG ER capsule Take 1 capsule po QAM and Take three capsules po QHS for seizures   No facility-administered encounter medications on file as of 10/03/2019.    Surgical History: History reviewed. No pertinent surgical history.  Medical History: Past Medical History:  Diagnosis Date  . Cerebral palsy ([place])   . 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 Seizure disorder [facility])     Family History: Family History  Problem Relation Age of Onset  . Hypertension Mother   . Asthma Mother   . Asthma Sister   . Hypertension Maternal Aunt   . Hypertension Maternal Grandmother    . Stroke Maternal Grandmother   . Cancer Maternal Grandfather   . Stroke Paternal Grandmother     Social History   Socioeconomic History  . Marital status: Single    Spouse name: Not on file  . Number of children: Not on file  . Years of education: Not on file  . Highest education level: Not on file  Occupational History  . Not on file  Tobacco Use  . Smoking status: Never Smoker  . Smokeless tobacco: Never Used  Substance and Sexual Activity  . Alcohol use: Never  . Drug use: Never  . Sexual activity: Not on file  Other Topics Concern  . Not on file  Social History Narrative  . Not on file   Social Determinants of Health   Financial Resource Strain:   . Difficulty of Paying Living Expenses: Not on file  Food Insecurity:   . Worried About Charity fundraiser in the Last Year: Not on file  . Ran Out of Food in the Last Year: Not on file  Transportation Needs:   . Lack of Transportation (Medical): Not on file  . Lack of Transportation (Non-Medical): Not on file  Physical Activity:   . Days of Exercise per Week: Not on file  . Minutes of Exercise per Session: Not on file  Stress:   . Feeling of Stress : Not on file  Social Connections:   . Frequency of Communication with Friends and Family: Not on  file  . Frequency of Social Gatherings with Friends and Family: Not on file  . Attends Religious Services: Not on file  . Active Member of Clubs or Organizations: Not on file  . Attends Banker Meetings: Not on file  . Marital Status: Not on file  Intimate Partner Violence:   . Fear of Current or Ex-Partner: Not on file  . Emotionally Abused: Not on file  . Physically Abused: Not on file  . Sexually Abused: Not on file      Review of Systems  Constitutional: Negative.  Negative for chills, fatigue and unexpected weight change.  HENT: Negative.  Negative for congestion, rhinorrhea, sneezing and sore throat.   Eyes: Negative for redness.  Respiratory:  Negative.  Negative for cough, chest tightness and shortness of breath.   Cardiovascular: Negative.  Negative for chest pain and palpitations.  Gastrointestinal: Negative.  Negative for abdominal pain, constipation, diarrhea, nausea and vomiting.  Endocrine: Negative.   Genitourinary: Negative.  Negative for dysuria and frequency.  Musculoskeletal: Negative.  Negative for arthralgias, back pain, joint swelling and neck pain.  Skin: Negative.  Negative for rash.  Allergic/Immunologic: Negative.   Neurological: Negative.  Negative for tremors and numbness.  Hematological: Negative for adenopathy. Does not bruise/bleed easily.  Psychiatric/Behavioral: Negative.  Negative for behavioral problems, sleep disturbance and suicidal ideas. The patient is not nervous/anxious.     Vital Signs: BP 117/74   Pulse 90   Resp 16   Ht 5\' 6"  (1.676 m)   Wt 108 lb (49 kg)   BMI 17.43 kg/m    Observation/Objective:  Well appearing, NAD noted.    Assessment/Plan: 1. Seizure disorder (HCC) Continue Dilantin, and tegretol as before.  Have levels redrawn before next appt.  - phenytoin (DILANTIN) 100 MG ER capsule; Take 1 capsule po QAM and Take one capsules po QHS for seizures  Dispense: 180 capsule; Refill: 1 - carbamazepine (CARBATROL) 200 MG 12 hr capsule; Take 1 capsule (200 mg total) by mouth 2 (two) times daily.  Dispense: 180 capsule; Refill: 1 - Carbamazepine Level (Tegretol), total - Dilantin (Phenytoin) level, total  2. Cerebral palsy, unspecified type (HCC) Stable, continue follow up and therapy as indicated.   General Counseling: Tony Harper understanding of the findings of today's phone visit and agrees with plan of treatment. I have discussed any further diagnostic evaluation that may be needed or ordered today. We also reviewed his medications today. he has been encouraged to call the office with any questions or concerns that should arise related to todays visit.    No orders  of the defined types were placed in this encounter.   No orders of the defined types were placed in this encounter.   Time spent: 20 Minutes    Fransico Meadow [facility] Internal medicine

## 2020-01-02 ENCOUNTER — Telehealth: Payer: Self-pay

## 2020-01-02 NOTE — Telephone Encounter (Signed)
Called lmom informing patient of appointment on 05/13/20212. klh

## 2020-01-04 ENCOUNTER — Encounter: Payer: Medicaid Other | Admitting: Adult Health

## 2020-03-14 ENCOUNTER — Other Ambulatory Visit: Payer: Self-pay

## 2020-03-14 ENCOUNTER — Encounter: Payer: Self-pay | Admitting: Adult Health

## 2020-03-14 ENCOUNTER — Ambulatory Visit: Payer: Medicaid Other | Admitting: Adult Health

## 2020-03-14 VITALS — BP 117/76 | HR 77 | Temp 98.1°F | Resp 16 | Ht 66.0 in | Wt 106.8 lb

## 2020-03-14 DIAGNOSIS — N3000 Acute cystitis without hematuria: Secondary | ICD-10-CM

## 2020-03-14 DIAGNOSIS — G40909 Epilepsy, unspecified, not intractable, without status epilepticus: Secondary | ICD-10-CM

## 2020-03-14 DIAGNOSIS — G809 Cerebral palsy, unspecified: Secondary | ICD-10-CM

## 2020-03-14 MED ORDER — NITROFURANTOIN MONOHYD MACRO 100 MG PO CAPS: 100.0000 mg | ORAL_CAPSULE | Freq: Two times a day (BID) | ORAL | 0 refills | Status: DC

## 2020-03-14 NOTE — Progress Notes (Signed)
[facility] [address] [place], [place] [address]  Internal MEDICINE  Office Visit Note  Patient Name: Tony Harper  [id number]  [id number]  Date of Service: 03/14/2020  Chief Complaint  Patient presents with  . Acute Visit    possible uti pain with urination     HPI Pt is here for a sick visit. He reports for a few weeks he has been having painful urination. He denies any hematuria or malodorous urine. He denies any other symptoms.     Current Medication:  Outpatient Encounter Medications as of 03/14/2020  Medication Sig  . carbamazepine (CARBATROL) 200 MG 12 hr capsule Take 1 capsule (200 mg total) by mouth 2 (two) times daily.  . phenytoin (DILANTIN) 100 MG ER capsule Take 1 capsule po QAM and Take one capsules po QHS for seizures  . nitrofurantoin, macrocrystal-monohydrate, (MACROBID) 100 MG capsule Take 1 capsule (100 mg total) by mouth 2 (two) times daily.   No facility-administered encounter medications on file as of 03/14/2020.      Medical History: Past Medical History:  Diagnosis Date  . Cerebral palsy (HCC)   . 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 Seizure disorder (HCC)      Vital Signs: BP 117/76   Pulse 77   Temp 98.1 F (36.7 C)   Resp 16   Ht 5\' 6"  (1.676 m)   Wt 106 lb 12.8 oz (48.4 kg)   SpO2 98%   BMI 17.24 kg/m    Review of Systems  Constitutional: Negative.  Negative for chills, fatigue and unexpected weight change.  HENT: Negative.  Negative for congestion, rhinorrhea, sneezing and sore throat.   Eyes: Negative for redness.  Respiratory: Negative.  Negative for cough, chest tightness and shortness of breath.   Cardiovascular: Negative.  Negative for chest pain and palpitations.  Gastrointestinal: Negative.  Negative for abdominal pain, constipation, diarrhea, nausea and vomiting.  Endocrine: Negative.   Genitourinary: Positive for  difficulty urinating and dysuria. Negative for flank pain, frequency, hematuria and testicular pain.  Musculoskeletal: Negative.  Negative for arthralgias, back pain, joint swelling and neck pain.  Skin: Negative.  Negative for rash.  Allergic/Immunologic: Negative.   Neurological: Negative.  Negative for tremors and numbness.  Hematological: Negative for adenopathy. Does not bruise/bleed easily.  Psychiatric/Behavioral: Negative.  Negative for behavioral problems, sleep disturbance and suicidal ideas. The patient is not nervous/anxious.     Physical Exam Vitals and nursing note reviewed.  Constitutional:      General: He is not in acute distress.    Appearance: He is well-developed. He is not diaphoretic.  HENT:     Head: Normocephalic and atraumatic.     Mouth/Throat:     Pharynx: No oropharyngeal exudate.  Eyes:     Pupils: Pupils are equal, round, and reactive to light.  Neck:     Thyroid: No thyromegaly.     Vascular: No JVD.     Trachea: No tracheal deviation.  Cardiovascular:     Rate and Rhythm: Normal rate and regular rhythm.     Heart sounds: Normal heart sounds. No murmur heard.  No friction rub. No gallop.   Pulmonary:     Effort: Pulmonary effort is normal. No respiratory distress.     Breath sounds: Normal breath sounds. No wheezing or rales.  Chest:     Chest wall: No tenderness.  Abdominal:  Palpations: Abdomen is soft.     Tenderness: There is no abdominal tenderness. There is no guarding.  Musculoskeletal:        General: Normal range of motion.     Cervical back: Normal range of motion and neck supple.  Lymphadenopathy:     Cervical: No cervical adenopathy.  Skin:    General: Skin is warm and dry.  Neurological:     Mental Status: He is alert and oriented to person, place, and time.     Cranial Nerves: No cranial nerve deficit.  Psychiatric:        Behavior: Behavior normal.        Thought Content: Thought content normal.        Judgment:  Judgment normal.    Assessment/Plan: 1. Acute cystitis without hematuria Advised patient to take entire course of antibiotics as prescribed with food. Pt should return to clinic in 7-10 days if symptoms fail to improve or new symptoms develop.  - nitrofurantoin, macrocrystal-monohydrate, (MACROBID) 100 MG capsule; Take 1 capsule (100 mg total) by mouth 2 (two) times daily.  Dispense: 14 capsule; Refill: 0  2. Seizure disorder (HCC) No recent seizures, continue present mgmt.   3. Cerebral palsy, unspecified type (HCC) Stable per history.   General Counseling: jashua knaak understanding of the findings of todays visit and agrees with plan of treatment. I have discussed any further diagnostic evaluation that may be needed or ordered today. We also reviewed his medications today. he has been encouraged to call the office with any questions or concerns that should arise related to todays visit.   No orders of the defined types were placed in this encounter.   Meds ordered this encounter  Medications  . nitrofurantoin, macrocrystal-monohydrate, (MACROBID) 100 MG capsule    Sig: Take 1 capsule (100 mg total) by mouth 2 (two) times daily.    Dispense:  14 capsule    Refill:  0    Time spent: 25 Minutes  This patient was seen by Blima Ledger AGNP-C in Collaboration with Dr Lyndon Code as a part of collaborative care agreement.  Johnna Acosta AGNP-C Internal Medicine

## 2020-03-15 NOTE — Progress Notes (Signed)
Needs urine c/s

## 2020-03-18 ENCOUNTER — Telehealth: Payer: Self-pay

## 2020-03-18 ENCOUNTER — Other Ambulatory Visit: Payer: Self-pay

## 2020-03-18 LAB — URINE CULTURE, REFLEX

## 2020-03-18 LAB — MICROSCOPIC EXAMINATION
Casts: NONE SEEN /lpf
RBC, Urine: NONE SEEN /hpf (ref 0–2)

## 2020-03-18 LAB — UA/M W/RFLX CULTURE, ROUTINE
Bilirubin, UA: NEGATIVE
Glucose, UA: NEGATIVE
Ketones, UA: NEGATIVE
Nitrite, UA: POSITIVE — AB
Protein,UA: NEGATIVE
RBC, UA: NEGATIVE
Specific Gravity, UA: 1.018 (ref 1.005–1.030)
Urobilinogen, Ur: 1 mg/dL (ref 0.2–1.0)
pH, UA: 7.5 (ref 5.0–7.5)

## 2020-03-18 MED ORDER — CIPROFLOXACIN HCL 500 MG PO TABS: 500.0000 mg | ORAL_TABLET | Freq: Two times a day (BID) | ORAL | 0 refills | Status: DC

## 2020-03-18 NOTE — Telephone Encounter (Signed)
Try to call several times on voice mail

## 2020-03-19 ENCOUNTER — Telehealth: Payer: Self-pay

## 2020-03-19 NOTE — Telephone Encounter (Signed)
none

## 2020-03-21 ENCOUNTER — Telehealth: Payer: Self-pay

## 2020-03-21 ENCOUNTER — Other Ambulatory Visit: Payer: Self-pay

## 2020-03-21 NOTE — Telephone Encounter (Signed)
Try several times to call pt no voicemail send pres to phar with note

## 2020-03-24 ENCOUNTER — Other Ambulatory Visit: Payer: Self-pay | Admitting: Adult Health

## 2020-03-24 DIAGNOSIS — G40909 Epilepsy, unspecified, not intractable, without status epilepticus: Secondary | ICD-10-CM

## 2020-03-26 ENCOUNTER — Telehealth: Payer: Self-pay

## 2020-03-26 NOTE — Telephone Encounter (Signed)
-----   Message from Lyndon Code, MD sent at 03/18/2020  7:29 AM EDT ----- Please call in cipro 500 mg po bid x 10 days # 20

## 2020-03-26 NOTE — Telephone Encounter (Signed)
Try to call mom several times no voicemail we already send phar pres and also send mom mychart message to call office

## 2020-05-22 ENCOUNTER — Other Ambulatory Visit: Payer: Self-pay | Admitting: Adult Health

## 2020-05-22 DIAGNOSIS — G40909 Epilepsy, unspecified, not intractable, without status epilepticus: Secondary | ICD-10-CM

## 2020-07-13 IMAGING — CT CT HEAD W/O CM
4 series · 16 of 47 positions shown, 18 images · non-contrast
Comparison: None.

CLINICAL DATA: First seizure in 5 years. Altered level of
consciousness, unexplained. Fever.

EXAM:
CT HEAD WITHOUT CONTRAST
TECHNIQUE: Contiguous axial images were obtained from the base of the skull
through the vertex without intravenous contrast.

[Series 2: head wo · axial · 0.42mm/px · z∈[-133,-18]mm · 7 of 31 slices shown, 9 images]
[im 4/31  brain]
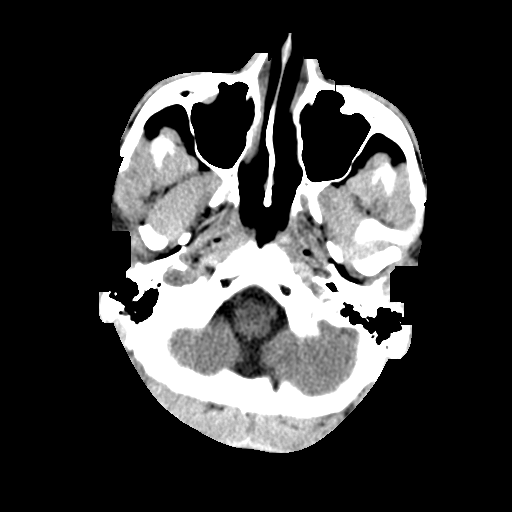
[im 4/31  bone]
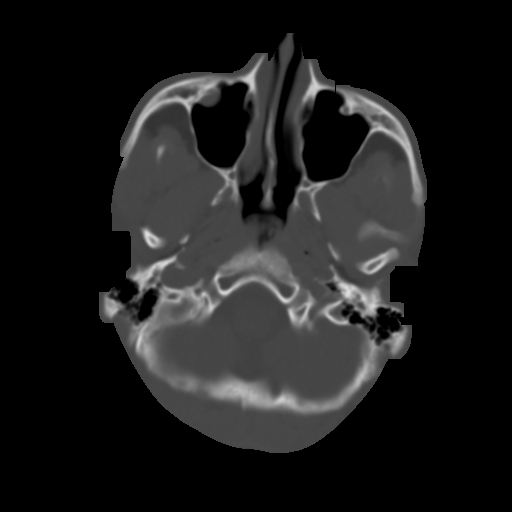
[im 8/31  brain]
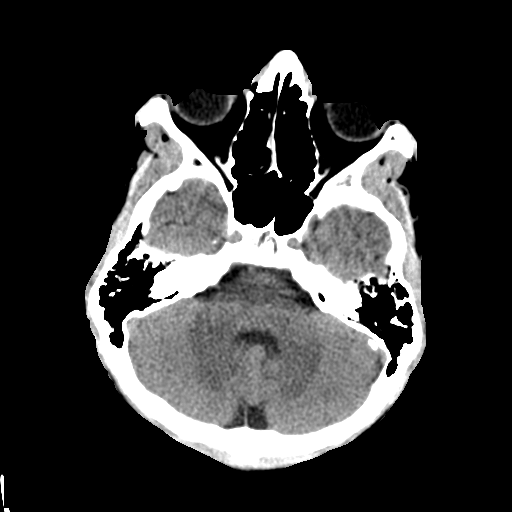
[im 12/31  brain]
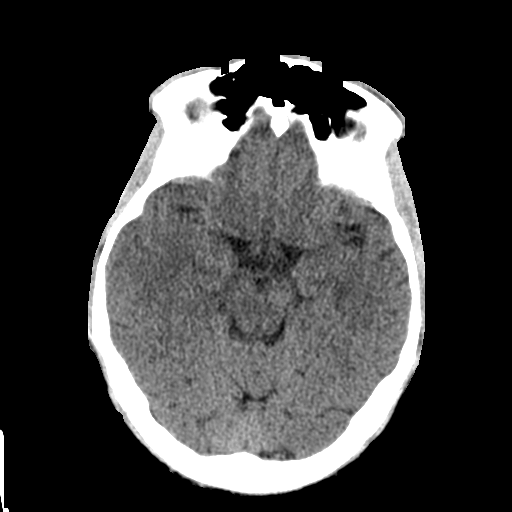
[im 16/31  brain]
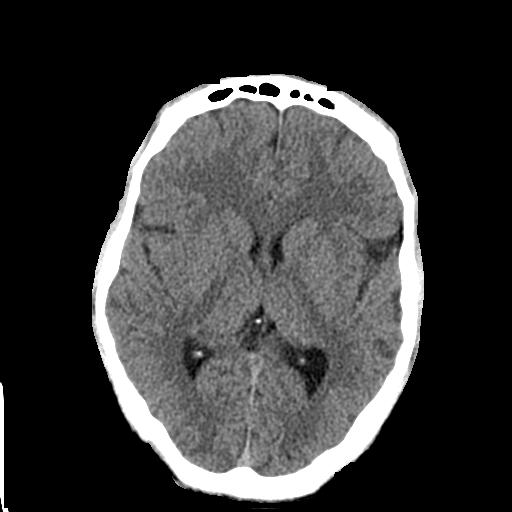
[im 19/31  brain]
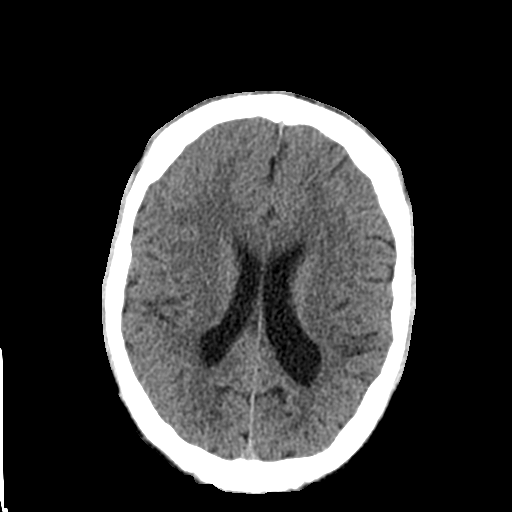
[im 19/31  bone]
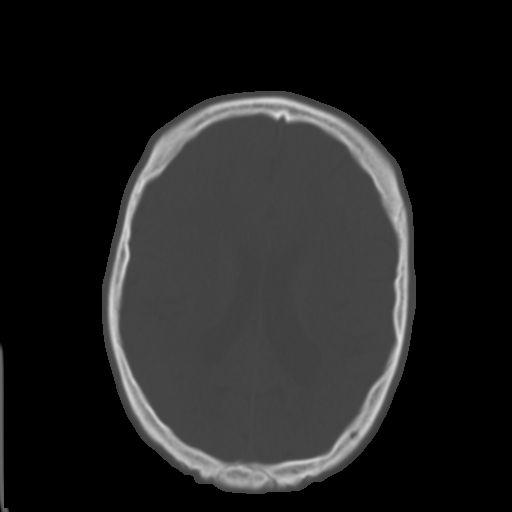
[im 23/31  brain]
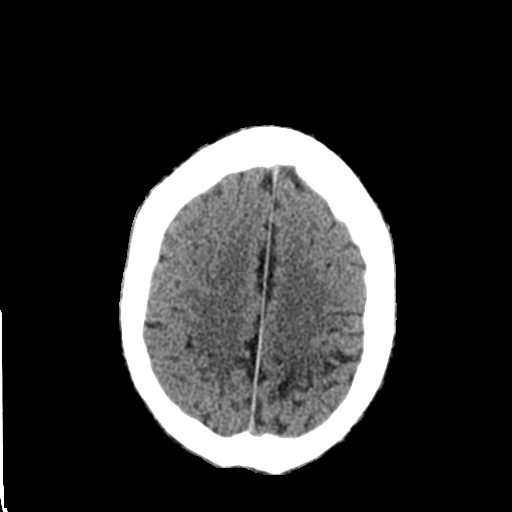
[im 27/31  brain]
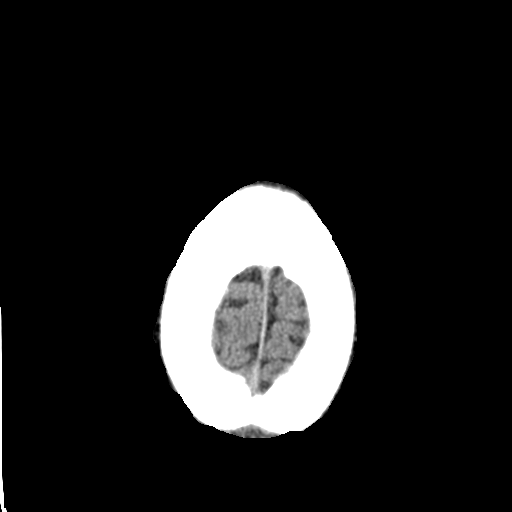

[Series 3: head bone · axial · 0.42mm/px · z∈[-134,-102]mm · 3 of 78 slices shown]
[im 8/78  bone]
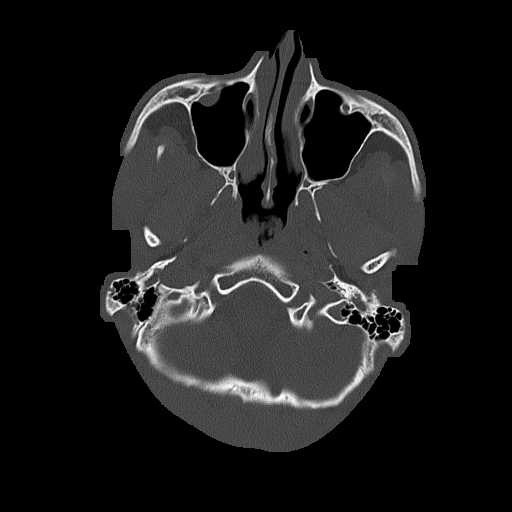
[im 16/78  bone]
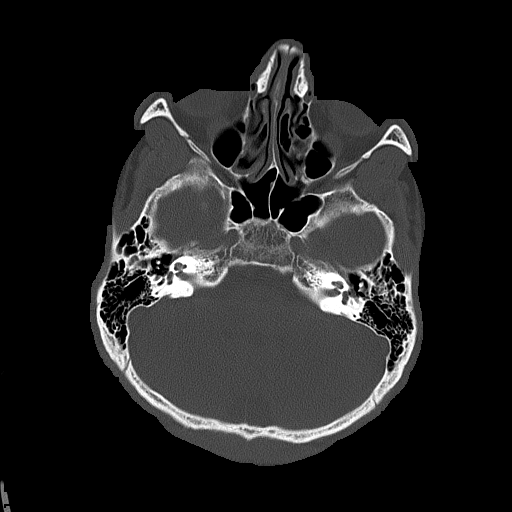
[im 24/78  bone]
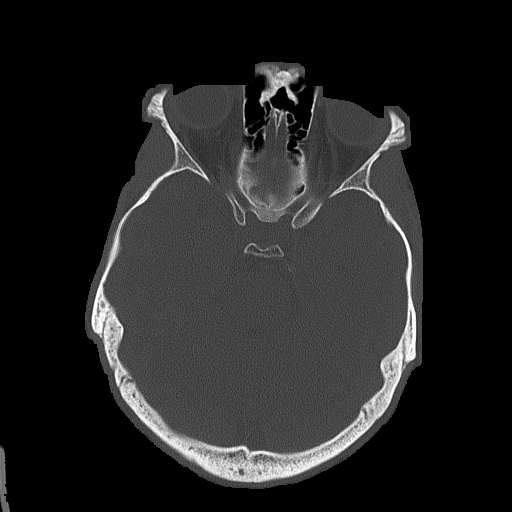

[Series 4: coronal soft tissue · coronal · 0.30mm/px · 3 of 63 slices shown]
[im 21/63  brain]
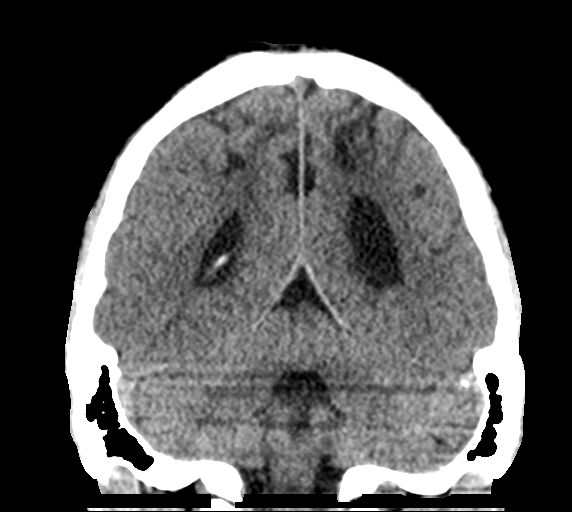
[im 28/63  brain]
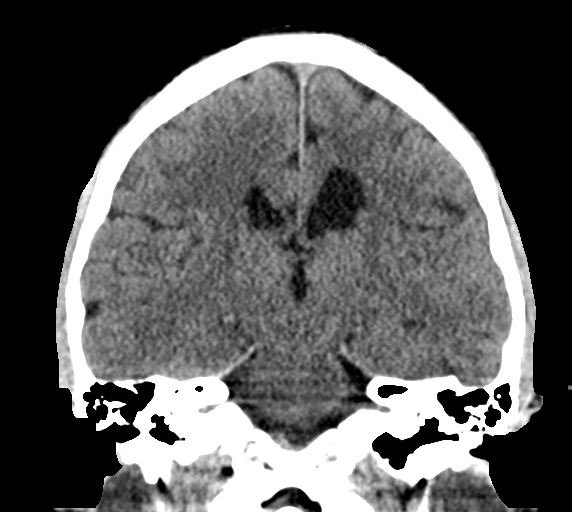
[im 35/63  brain]
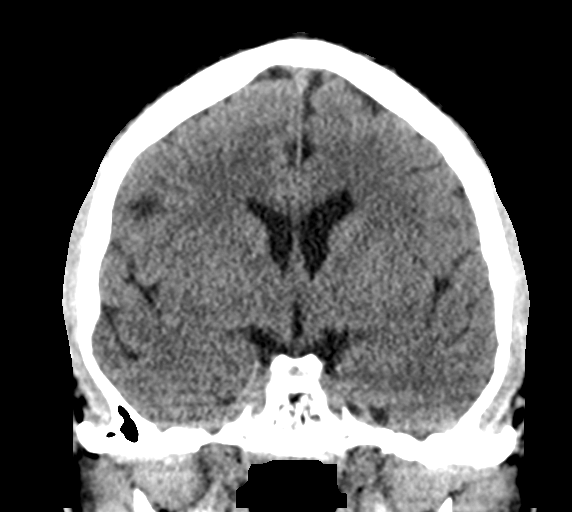

[Series 5: sagittal soft tissue · sagittal · 0.30mm/px · 3 of 49 slices shown]
[im 17/49  brain]
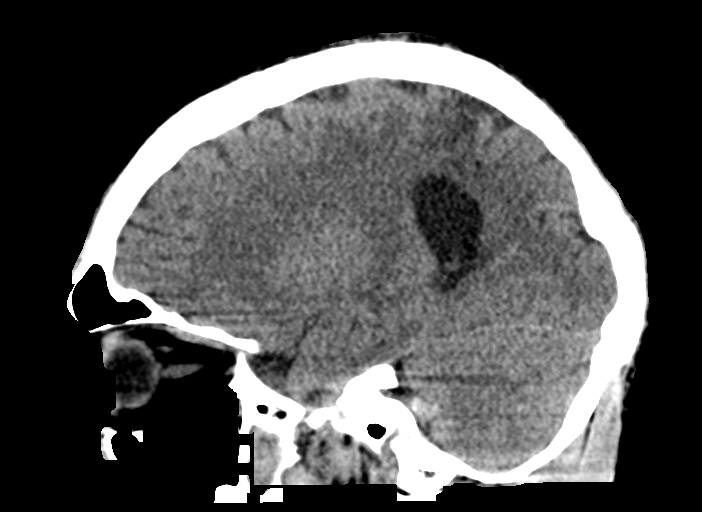
[im 25/49  brain]
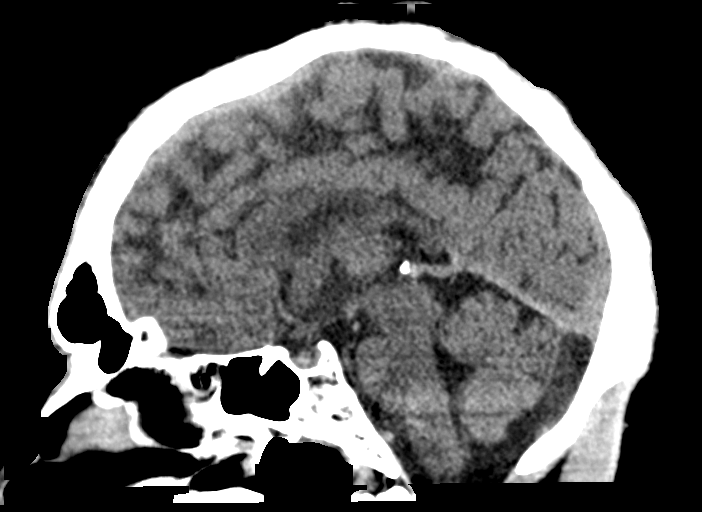
[im 33/49  brain]
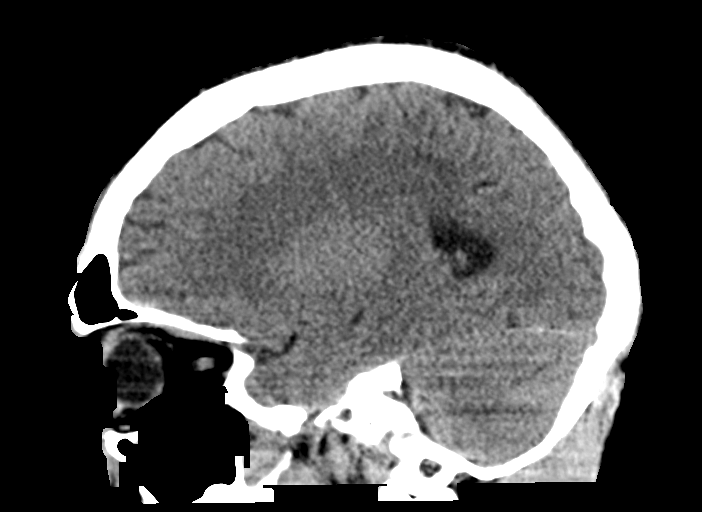

[16 of 47 positions shown; findings below may reference images not displayed]

FINDINGS: Brain: No acute hemorrhage. Left parietal encephalomalacia with
associated ex vacuo dilatation of the left lateral ventricle. No
evidence of acute ischemia. No midline shift or mass effect. No
hydrocephalus. The basilar cisterns are patent. No subdural
collection.

Vascular: No hyperdense vessel or unexpected calcification.

Skull: No fracture or focal lesion.

Sinuses/Orbits: Paranasal sinuses and mastoid air cells are clear.
Small mucous retention cyst in the right maxillary sinus. The
visualized orbits are unremarkable.

Other: None.
IMPRESSION: 1. No acute intracranial abnormality.
2. Remote left parietal infarct with mild ex vacuo dilatation of the
left lateral ventricle.

## 2020-07-14 IMAGING — US US ABDOMEN LIMITED
1 series · 14 of 25 positions shown · non-contrast
Comparison: Chest, abdomen and pelvis CT, 07/22/2018

CLINICAL DATA: Right upper quadrant pain.

EXAM:
ULTRASOUND ABDOMEN LIMITED RIGHT UPPER QUADRANT

[Series 2: us abdomen limited · 14 of 50 slices shown]
[im 1/50]
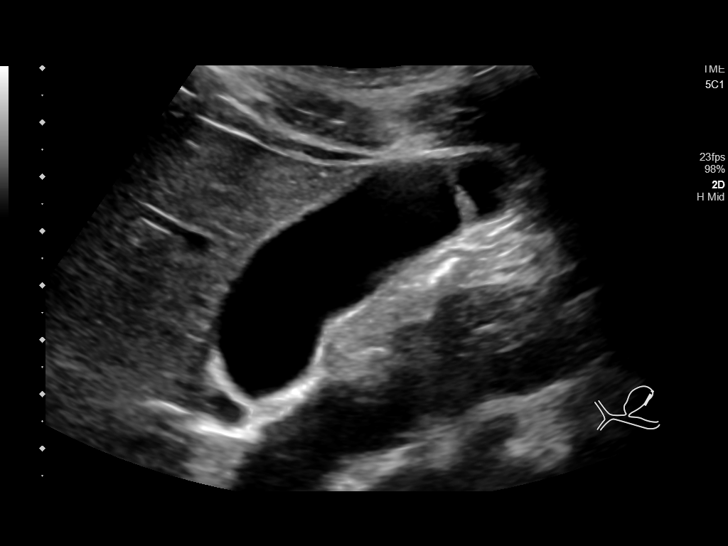
[im 5/50]
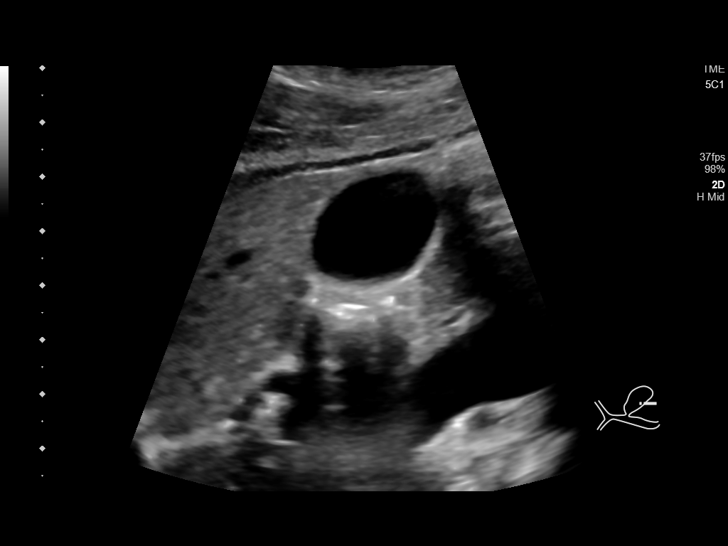
[im 9/50]
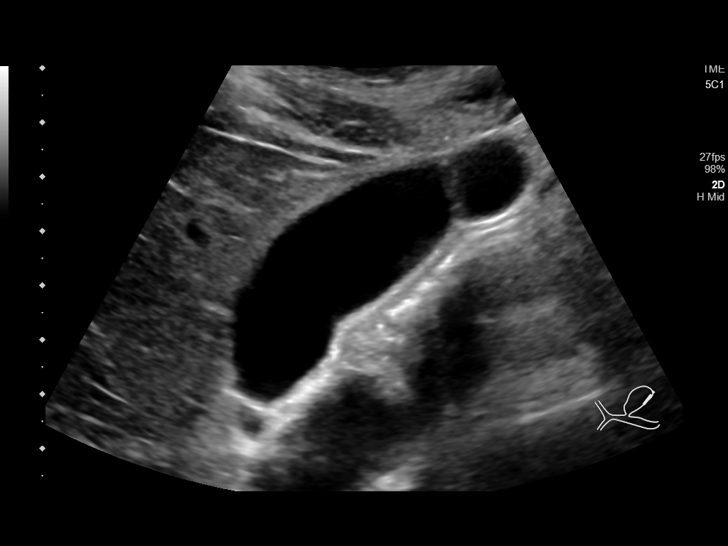
[im 13/50]
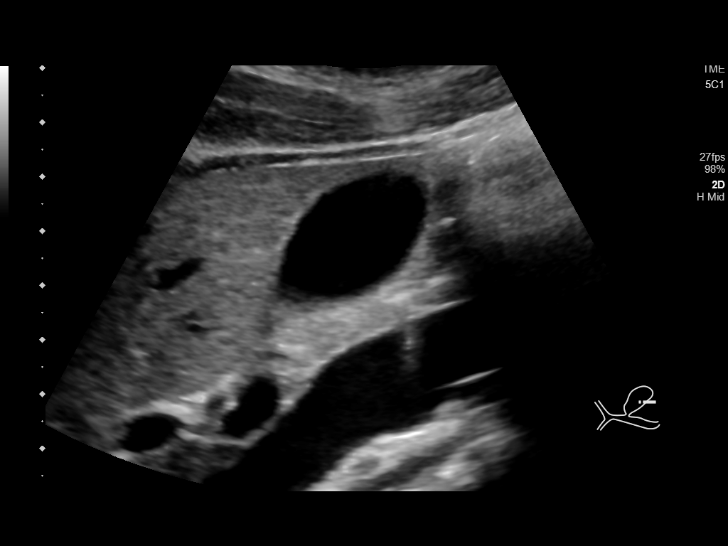
[im 17/50]
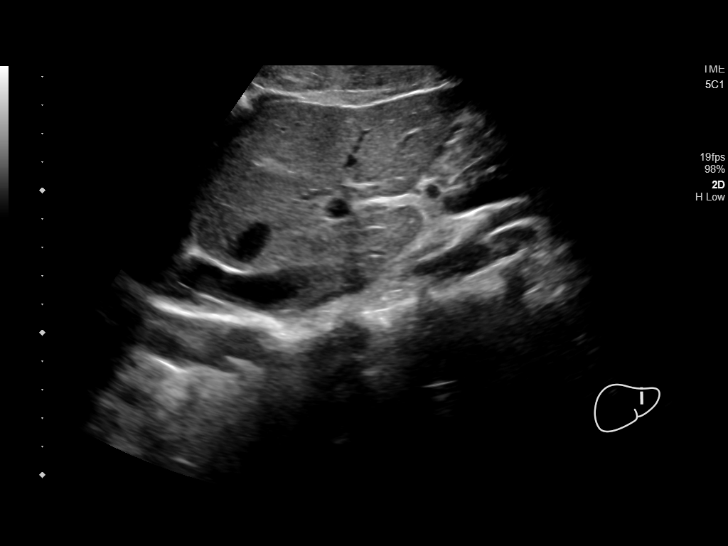
[im 19/50]
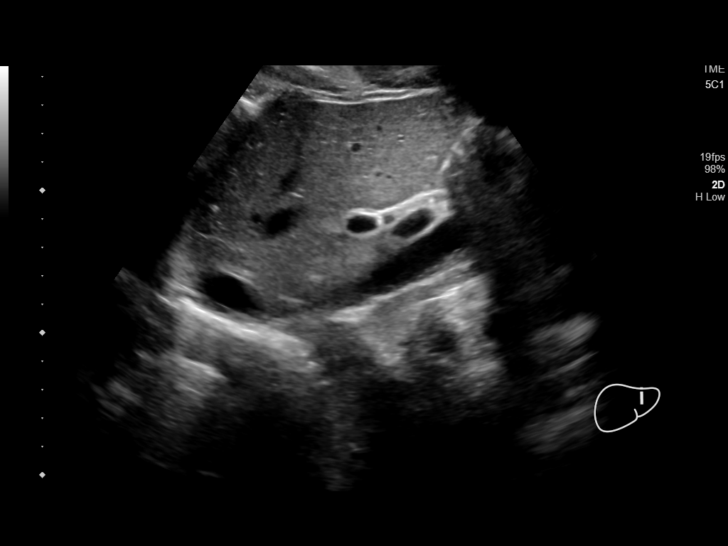
[im 23/50]
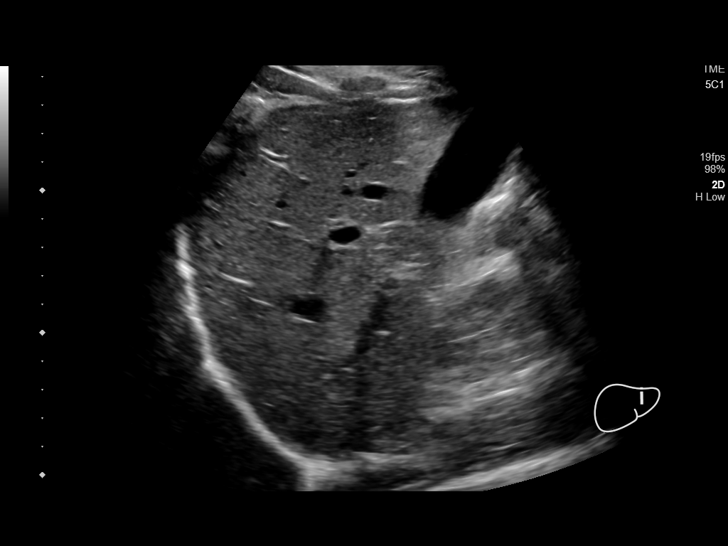
[im 27/50]
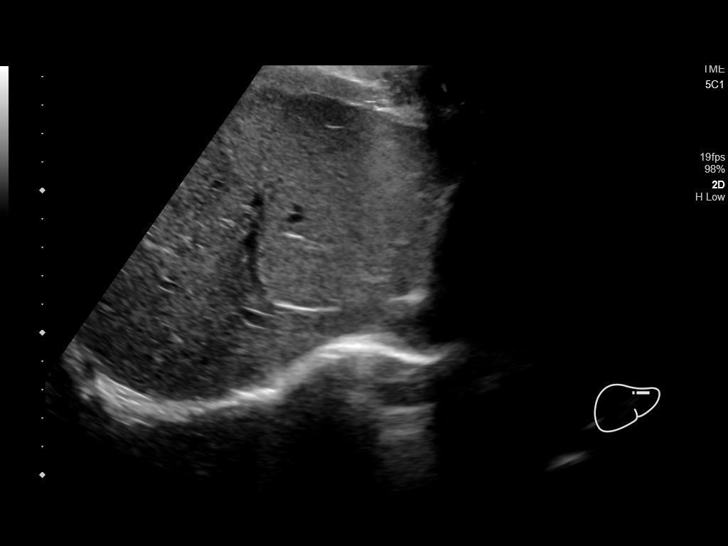
[im 31/50]
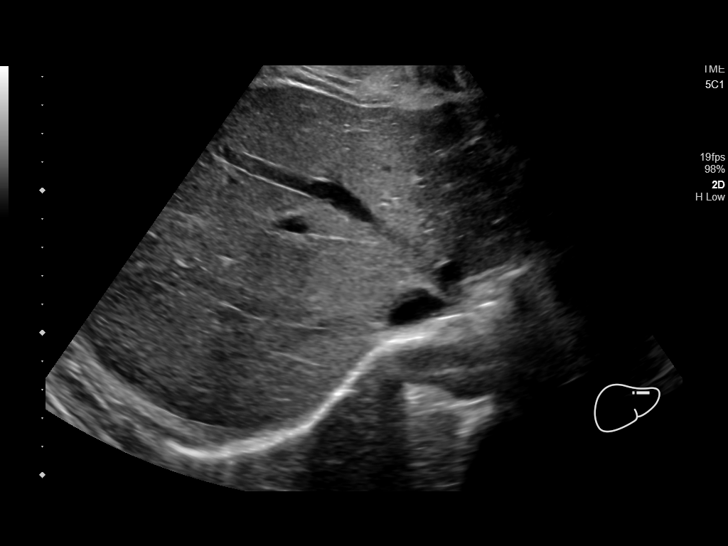
[im 33/50]
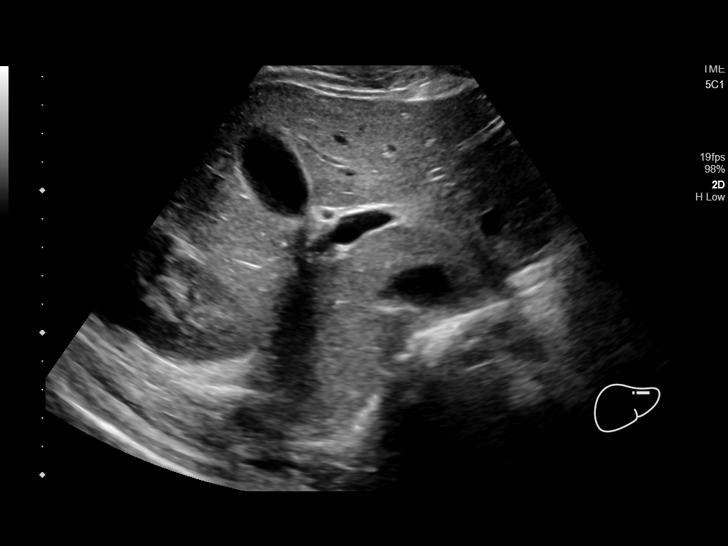
[im 37/50]
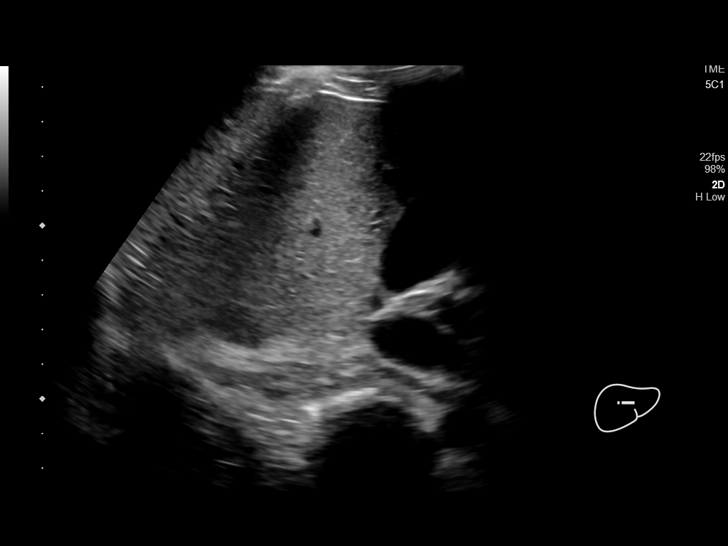
[im 41/50]
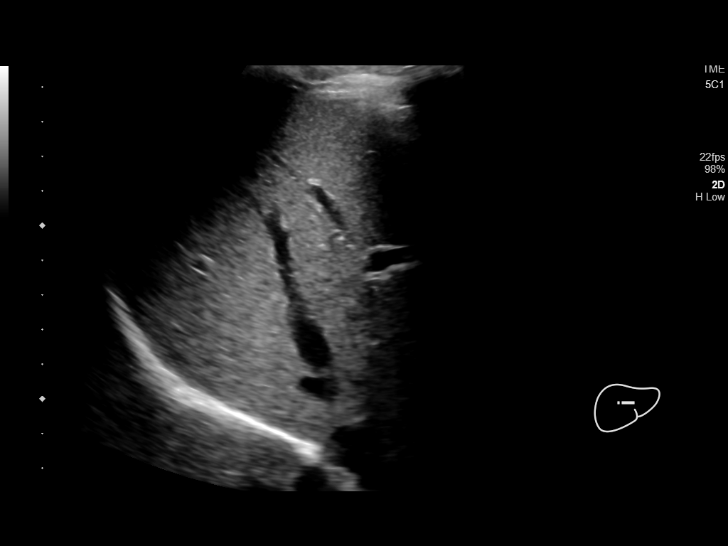
[im 45/50]
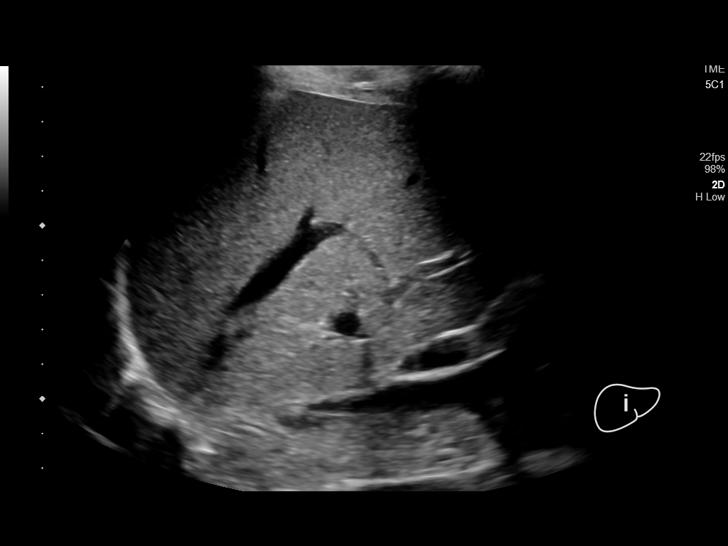
[im 50/50]
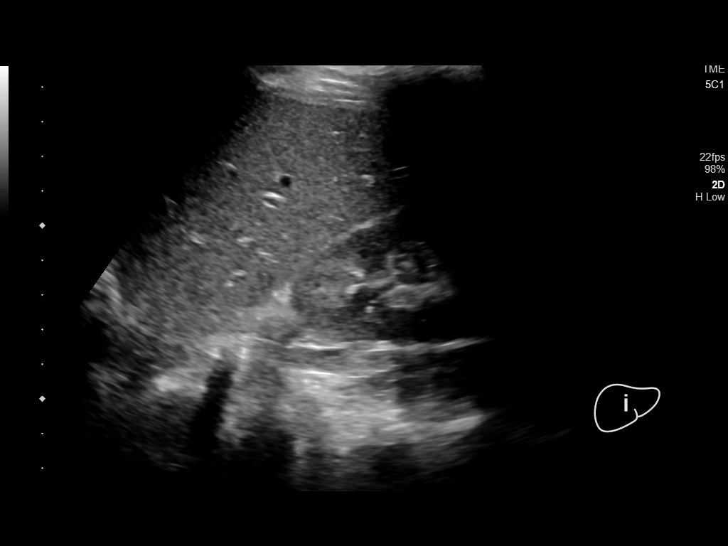

[14 of 25 positions shown; findings below may reference images not displayed]

FINDINGS: Gallbladder:

No gallstones or wall thickening visualized. No sonographic Murphy
sign noted by sonographer.

Common bile duct:

Diameter: 2 mm

Liver:

No focal lesion identified. Within normal limits in parenchymal
echogenicity. Portal vein is patent on color Doppler imaging with
normal direction of blood flow towards the liver.
IMPRESSION: Normal right upper quadrant ultrasound. No evidence of acute
cholecystitis. No gallstones.

## 2020-09-26 ENCOUNTER — Ambulatory Visit: Payer: Medicaid Other | Admitting: Hospice and Palliative Medicine

## 2020-09-27 ENCOUNTER — Ambulatory Visit (INDEPENDENT_AMBULATORY_CARE_PROVIDER_SITE_OTHER): Payer: Medicaid Other | Admitting: Hospice and Palliative Medicine

## 2020-09-27 ENCOUNTER — Encounter: Payer: Self-pay | Admitting: Hospice and Palliative Medicine

## 2020-09-27 ENCOUNTER — Other Ambulatory Visit: Payer: Self-pay | Admitting: Hospice and Palliative Medicine

## 2020-09-27 VITALS — BP 116/80 | HR 85 | Temp 97.9°F | Resp 16 | Ht 66.0 in | Wt 106.0 lb

## 2020-09-27 DIAGNOSIS — H65191 Other acute nonsuppurative otitis media, right ear: Secondary | ICD-10-CM

## 2020-09-27 DIAGNOSIS — G809 Cerebral palsy, unspecified: Secondary | ICD-10-CM

## 2020-09-27 DIAGNOSIS — H6122 Impacted cerumen, left ear: Secondary | ICD-10-CM

## 2020-09-27 MED ORDER — PREDNISONE 10 MG PO TABS
ORAL_TABLET | ORAL | 0 refills | Status: DC
Start: 2020-09-27 — End: 2021-04-15

## 2020-09-27 MED ORDER — CIPROFLOXACIN-DEXAMETHASONE 0.3-0.1 % OT SUSP: 4.0000 [drp] | Freq: Two times a day (BID) | OTIC | 0 refills | Status: DC

## 2020-09-27 MED ORDER — OFLOXACIN 0.3 % OT SOLN: 5.0000 [drp] | Freq: Every day | OTIC | 0 refills | Status: DC

## 2020-09-27 NOTE — Progress Notes (Signed)
[facility] [address] [place], [place] [address]  Internal MEDICINE  Office Visit Note  Patient Name: Tony Harper  [id number]  [id number]  Date of Service: 09/30/2020  Chief Complaint  Patient presents with  . Acute Visit    Ear drainage and pain in right ear, blood and pus draining out of ear, started 09/25/20 night, difficult to hear out of that ear, can hear pulse in ear as well     HPI Pt is here for a sick visit. Here with his mother, history of cerebral palsy She explains two nights ago his right eat started oozing drainage and blood--he has also been complaining of right ear pain and she has noticed his hearing is impaired on right compared to left Denies other symptoms such as sinus drainage, headache or sore throat--afebrile  Current Medication:  Outpatient Encounter Medications as of 09/27/2020  Medication Sig  . carbamazepine (CARBATROL) 200 MG 12 hr capsule TAKE 1 CAPSULE(200 MG) BY MOUTH TWICE DAILY  . ciprofloxacin-dexamethasone (CIPRODEX) OTIC suspension Place 4 drops into the right ear 2 (two) times daily.  . phenytoin (DILANTIN) 100 MG ER capsule TAKE ONE CAPSULE BY MOUTH EVERY MORNING AND 1 CAPSULE EVERY NIGHT AT BEDTIME FOR SEIZURES  . [DISCONTINUED] ciprofloxacin (CIPRO) 500 MG tablet Take 1 tablet (500 mg total) by mouth 2 (two) times daily. (Patient not taking: Reported on 09/27/2020)   No facility-administered encounter medications on file as of 09/27/2020.      Medical History: Past Medical History:  Diagnosis Date  . Cerebral palsy (HCC)   . 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 Seizure disorder (HCC)      Vital Signs: BP 116/80   Pulse 85   Temp 97.9 F (36.6 C)   Resp 16   Ht 5\' 6"  (1.676 m)   Wt 106 lb (48.1 kg)   SpO2 98%   BMI 17.11 kg/m    Review of Systems  Constitutional: Negative for chills, fatigue and unexpected weight change.   HENT: Positive for ear discharge and ear pain. Negative for congestion, postnasal drip, rhinorrhea, sneezing and sore throat.   Eyes: Negative for redness.  Respiratory: Negative for cough, chest tightness and shortness of breath.   Cardiovascular: Negative for chest pain and palpitations.  Gastrointestinal: Negative for abdominal pain, constipation, diarrhea, nausea and vomiting.  Genitourinary: Negative for dysuria and frequency.  Musculoskeletal: Negative for arthralgias, back pain, joint swelling and neck pain.  Skin: Negative for rash.  Neurological: Negative for tremors and numbness.  Hematological: Negative for adenopathy. Does not bruise/bleed easily.  Psychiatric/Behavioral: Negative for behavioral problems (Depression), sleep disturbance and suicidal ideas. The patient is not nervous/anxious.     Physical Exam Vitals reviewed.  Constitutional:      Appearance: Normal appearance. He is normal weight.  HENT:     Right Ear: Drainage and tenderness present.     Left Ear: There is impacted cerumen.     Ears:     Comments: Yellowish/brownish mixed with blood discharge from right ear--unable to visualize TM due to excessive discharge Neurological:     Mental Status: He is alert.    Assessment/Plan: 1. Other non-recurrent acute nonsuppurative otitis media of right ear Possible ruptured TM Apply Ciprodex drops to affected Will follow-up in 1 week//referral to ENT if symptoms have not improved - ciprofloxacin-dexamethasone (CIPRODEX) OTIC suspension; Place 4 drops into the right ear 2 (two) times  daily.  Dispense: 7.5 mL; Refill: 0  2. Cerebral palsy, unspecified type (HCC) Followed by neurology  3. Impacted cerumen of left ear Discussed with mother to apply debrox drops as needed--will reassess and flush ear as needed  General Counseling: Eliceo verbalizes understanding of the findings of todays visit and agrees with plan of treatment. I have discussed any further diagnostic  evaluation that may be needed or ordered today. We also reviewed his medications today. he has been encouraged to call the office with any questions or concerns that should arise related to todays visit.  Meds ordered this encounter  Medications  . ciprofloxacin-dexamethasone (CIPRODEX) OTIC suspension    Sig: Place 4 drops into the right ear 2 (two) times daily.    Dispense:  7.5 mL    Refill:  0    Time spent: 25 Minutes Time spent includes review of chart, medications, test results and follow-up plan with the patient.  This patient was seen by Leeanne Deed AGNP-C in Collaboration with Dr Lyndon Code as a part of collaborative care agreement.  Lubertha Basque [facility] Internal Medicine

## 2020-09-30 ENCOUNTER — Encounter: Payer: Self-pay | Admitting: Hospice and Palliative Medicine

## 2020-10-01 ENCOUNTER — Telehealth: Payer: Self-pay

## 2020-10-01 NOTE — Telephone Encounter (Signed)
LMOM for pt's mother to call back about pt's ear to let us know if he has improved

## 2020-10-01 NOTE — Telephone Encounter (Signed)
-----   Message from Theotis Burrow, NP sent at 09/30/2020  7:34 PM EST ----- Please call and check with his mother to see if his ear symptoms have improved. Thanks.

## 2020-10-07 ENCOUNTER — Other Ambulatory Visit: Payer: Self-pay

## 2020-10-07 ENCOUNTER — Encounter: Payer: Self-pay | Admitting: Hospice and Palliative Medicine

## 2020-10-07 ENCOUNTER — Ambulatory Visit (INDEPENDENT_AMBULATORY_CARE_PROVIDER_SITE_OTHER): Payer: Medicaid Other | Admitting: Hospice and Palliative Medicine

## 2020-10-07 VITALS — HR 95 | Temp 97.8°F | Resp 16 | Ht 66.0 in | Wt 106.0 lb

## 2020-10-07 DIAGNOSIS — H65192 Other acute nonsuppurative otitis media, left ear: Secondary | ICD-10-CM | POA: Diagnosis not present

## 2020-10-07 DIAGNOSIS — G809 Cerebral palsy, unspecified: Secondary | ICD-10-CM

## 2020-10-07 NOTE — Progress Notes (Signed)
[facility] [address] [place], [place] [address]  Internal MEDICINE  Office Visit Note  Patient Name: Tony Harper  [id number]  [id number]  Date of Service: 10/08/2020  Chief Complaint  Patient presents with  . Follow-up  . Quality Metric Gaps    Hep C, tetnaus    HPI Patient is here for routine follow-up Recently seen in office and treated for right sided otitis media--has finished with 1 week of Ciprodex drops Mom is again with him during today's visit--she reports that pain and discomfort has resolved, drainage has also significantly improved She has also noticed that his hearing on right side has returned to his baseline He does routinely wear headphone while playing video games majority of the day   Current Medication: Outpatient Encounter Medications as of 10/07/2020  Medication Sig  . carbamazepine (CARBATROL) 200 MG 12 hr capsule TAKE 1 CAPSULE(200 MG) BY MOUTH TWICE DAILY  . ciprofloxacin-dexamethasone (CIPRODEX) OTIC suspension Place 4 drops into the right ear 2 (two) times daily.  Marland Kitchen ofloxacin (FLOXIN) 0.3 % OTIC solution Place 5 drops into the right ear daily.  . phenytoin (DILANTIN) 100 MG ER capsule TAKE ONE CAPSULE BY MOUTH EVERY MORNING AND 1 CAPSULE EVERY NIGHT AT BEDTIME FOR SEIZURES  . predniSONE (DELTASONE) 10 MG tablet Take 1 tablet three times a day with a meal for three for three days, take 1 tablet by twice daily with a meal for 3 days, take 1 tablet once daily with a meal for 3 days   No facility-administered encounter medications on file as of 10/07/2020.    Surgical History: History reviewed. No pertinent surgical history.  Medical History: Past Medical History:  Diagnosis Date  . Cerebral palsy (HCC)   . 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 Seizure disorder [facility])     Family History: Family History  Problem Relation Age of Onset  . Hypertension  Mother   . Asthma Mother   . Asthma Sister   . Hypertension Maternal Aunt   . Hypertension Maternal Grandmother   . Stroke Maternal Grandmother   . Cancer Maternal Grandfather   . Stroke Paternal Grandmother     Social History   Socioeconomic History  . Marital status: Single    Spouse name: Not on file  . Number of children: Not on file  . Years of education: Not on file  . Highest education level: Not on file  Occupational History  . Not on file  Tobacco Use  . Smoking status: Never Smoker  . Smokeless tobacco: Never Used  Vaping Use  . Vaping Use: Never used  Substance and Sexual Activity  . Alcohol use: Never  . Drug use: Never  . Sexual activity: Not on file  Other Topics Concern  . Not on file  Social History Narrative  . Not on file   Social Determinants of Health   Financial Resource Strain: Not on file  Food Insecurity: Not on file  Transportation Needs: Not on file  Physical Activity: Not on file  Stress: Not on file  Social Connections: Not on file  Intimate Partner Violence: Not on file      Review of Systems  Constitutional: Negative for chills, fatigue and unexpected weight change.  HENT: Negative for congestion, postnasal drip, rhinorrhea, sneezing and sore throat.   Eyes: Negative for redness.  Respiratory: Negative for cough, chest tightness and shortness of breath.  Cardiovascular: Negative for chest pain and palpitations.  Gastrointestinal: Negative for abdominal pain, constipation, diarrhea, nausea and vomiting.  Genitourinary: Negative for dysuria and frequency.  Musculoskeletal: Negative for arthralgias, back pain, joint swelling and neck pain.  Skin: Negative for rash.  Neurological: Negative for tremors and numbness.  Hematological: Negative for adenopathy. Does not bruise/bleed easily.  Psychiatric/Behavioral: Negative for behavioral problems (Depression), sleep disturbance and suicidal ideas. The patient is not nervous/anxious.      Vital Signs: Pulse 95   Temp 97.8 F (36.6 C)   Resp 16   Ht 5\' 6"  (1.676 m)   Wt 106 lb (48.1 kg)   SpO2 98%   BMI 17.11 kg/m    Physical Exam Vitals reviewed.  Constitutional:      Appearance: Normal appearance. He is normal weight.  HENT:     Right Ear: Tympanic membrane and ear canal normal.     Left Ear: Drainage and swelling present.     Ears:     Comments: Drainage and appearance of right canal improved--able to visualize TM--WNL Drainage and swelling now present to left ear--unable to visualize TM Cardiovascular:     Rate and Rhythm: Normal rate and regular rhythm.     Pulses: Normal pulses.     Heart sounds: Normal heart sounds.  Pulmonary:     Effort: Pulmonary effort is normal.     Breath sounds: Normal breath sounds.  Musculoskeletal:        General: Normal range of motion.  Skin:    General: Skin is warm.  Neurological:     General: No focal deficit present.     Mental Status: He is alert and oriented to person, place, and time. Mental status is at baseline.  Psychiatric:        Mood and Affect: Mood normal.        Behavior: Behavior normal.        Thought Content: Thought content normal.        Judgment: Judgment normal.    Assessment/Plan: 1. Other non-recurrent acute nonsuppurative otitis media of left ear Apply drops prescribed at previous visit to left ear Discussed with him and mother about keeping headphones clean and taking some time off from wearing headphones most of the day  2. Cerebral palsy, unspecified type (HCC) Stable, followed by neurology  General Counseling: understanding of the findings of todays visit and agrees with plan of treatment. I have discussed any further diagnostic evaluation that may be needed or ordered today. We also reviewed his medications today. he has been encouraged to call the office with any questions or concerns that should arise related to todays visit.   Time spent: 30 Minutes Time  spent includes review of chart, medications, test results and follow-up plan with the patient.  This patient was seen by Fransico Meadow AGNP-C in Collaboration with Dr Leeanne Deed as a part of collaborative care agreement     Lyndon Code. Dazha Kempa AGNP-C Internal medicine

## 2020-10-08 ENCOUNTER — Encounter: Payer: Self-pay | Admitting: Hospice and Palliative Medicine

## 2020-10-17 ENCOUNTER — Other Ambulatory Visit: Payer: Self-pay | Admitting: Adult Health

## 2020-10-17 DIAGNOSIS — G40909 Epilepsy, unspecified, not intractable, without status epilepticus: Secondary | ICD-10-CM

## 2020-10-18 ENCOUNTER — Telehealth: Payer: Self-pay

## 2020-10-18 ENCOUNTER — Other Ambulatory Visit: Payer: Self-pay

## 2020-10-18 DIAGNOSIS — G40909 Epilepsy, unspecified, not intractable, without status epilepticus: Secondary | ICD-10-CM

## 2020-10-18 MED ORDER — CARBAMAZEPINE ER 200 MG PO CP12: ORAL_CAPSULE | ORAL | 0 refills | Status: DC

## 2020-10-18 NOTE — Telephone Encounter (Signed)
Pt mom advised we send medicine to phar and also lab order to recheck level  For dilantin and carbanmazepine

## 2020-10-24 ENCOUNTER — Other Ambulatory Visit: Payer: Self-pay | Admitting: Adult Health

## 2020-10-24 DIAGNOSIS — N3000 Acute cystitis without hematuria: Secondary | ICD-10-CM

## 2020-11-12 LAB — CARBAMAZEPINE LEVEL, TOTAL: Carbamazepine (Tegretol), S: 5.5 ug/mL (ref 4.0–12.0)

## 2020-11-12 LAB — PHENYTOIN LEVEL, TOTAL: Phenytoin (Dilantin), Serum: 4.5 ug/mL — ABNORMAL LOW (ref 10.0–20.0)

## 2020-11-13 ENCOUNTER — Other Ambulatory Visit: Payer: Self-pay | Admitting: Hospice and Palliative Medicine

## 2020-11-13 MED ORDER — PHENYTOIN SODIUM EXTENDED 100 MG PO CAPS: ORAL_CAPSULE | ORAL | 1 refills | Status: DC

## 2020-11-13 NOTE — Progress Notes (Signed)
Called and spoke with [name] Kilgallon--mother, will increase Dilantin to 2 capsules at bedtime, continue with 1 capsule in AM.

## 2020-11-18 ENCOUNTER — Other Ambulatory Visit: Payer: Self-pay | Admitting: Adult Health

## 2020-12-27 ENCOUNTER — Other Ambulatory Visit: Payer: Self-pay | Admitting: Hospice and Palliative Medicine

## 2020-12-27 ENCOUNTER — Telehealth: Payer: Self-pay

## 2020-12-27 DIAGNOSIS — G40909 Epilepsy, unspecified, not intractable, without status epilepticus: Secondary | ICD-10-CM

## 2020-12-27 NOTE — Telephone Encounter (Signed)
Call pt mother to inform her that orders were in to recheck [facility] levels

## 2021-01-14 ENCOUNTER — Other Ambulatory Visit: Payer: Self-pay

## 2021-01-14 DIAGNOSIS — G40909 Epilepsy, unspecified, not intractable, without status epilepticus: Secondary | ICD-10-CM

## 2021-01-14 MED ORDER — CARBAMAZEPINE ER 200 MG PO CP12: ORAL_CAPSULE | ORAL | 1 refills | Status: DC

## 2021-03-17 ENCOUNTER — Other Ambulatory Visit: Payer: Self-pay

## 2021-03-17 MED ORDER — PHENYTOIN SODIUM EXTENDED 100 MG PO CAPS: ORAL_CAPSULE | ORAL | 0 refills | Status: DC

## 2021-04-09 ENCOUNTER — Ambulatory Visit: Payer: Medicaid Other | Admitting: Nurse Practitioner

## 2021-04-15 ENCOUNTER — Ambulatory Visit (INDEPENDENT_AMBULATORY_CARE_PROVIDER_SITE_OTHER): Payer: Medicaid Other | Admitting: Nurse Practitioner

## 2021-04-15 ENCOUNTER — Encounter (INDEPENDENT_AMBULATORY_CARE_PROVIDER_SITE_OTHER): Payer: Self-pay

## 2021-04-15 ENCOUNTER — Encounter: Payer: Self-pay | Admitting: Nurse Practitioner

## 2021-04-15 ENCOUNTER — Other Ambulatory Visit: Payer: Self-pay

## 2021-04-15 VITALS — BP 140/82 | HR 103 | Temp 97.8°F | Resp 16 | Ht 65.5 in | Wt 106.0 lb

## 2021-04-15 DIAGNOSIS — R300 Dysuria: Secondary | ICD-10-CM

## 2021-04-15 DIAGNOSIS — H6122 Impacted cerumen, left ear: Secondary | ICD-10-CM | POA: Diagnosis not present

## 2021-04-15 DIAGNOSIS — N390 Urinary tract infection, site not specified: Secondary | ICD-10-CM

## 2021-04-15 DIAGNOSIS — N3001 Acute cystitis with hematuria: Secondary | ICD-10-CM | POA: Diagnosis not present

## 2021-04-15 DIAGNOSIS — R81 Glycosuria: Secondary | ICD-10-CM | POA: Diagnosis not present

## 2021-04-15 LAB — POCT URINALYSIS DIPSTICK
Bilirubin, UA: NEGATIVE
Glucose, UA: POSITIVE — AB
Nitrite, UA: POSITIVE
Protein, UA: POSITIVE — AB
Spec Grav, UA: 1.02 (ref 1.010–1.025)
Urobilinogen, UA: 0.2 E.U./dL
pH, UA: 6.5 (ref 5.0–8.0)

## 2021-04-15 LAB — GLUCOSE, POCT (MANUAL RESULT ENTRY): POC Glucose: 129 mg/dl — AB (ref 70–99)

## 2021-04-15 LAB — POCT GLYCOSYLATED HEMOGLOBIN (HGB A1C): Hemoglobin A1C: 4.7 % (ref 4.0–5.6)

## 2021-04-15 MED ORDER — PHENAZOPYRIDINE HCL 200 MG PO TABS: 200.0000 mg | ORAL_TABLET | Freq: Three times a day (TID) | ORAL | 0 refills | Status: DC | PRN

## 2021-04-15 MED ORDER — CIPROFLOXACIN HCL 250 MG PO TABS: 250.0000 mg | ORAL_TABLET | Freq: Two times a day (BID) | ORAL | 0 refills | Status: AC

## 2021-04-15 NOTE — Progress Notes (Signed)
[facility] [address] [place], [place] [address]  Internal MEDICINE  Office Visit Note  Patient Name: Tony Harper  [id number]  [id number]  Date of Service: 04/15/2021  Chief Complaint  Patient presents with   Acute Visit   Urinary Tract Infection    Itching, peeing blood, pain when urinating, lower back pain, not sexually active, urgency and frequency, voiding very little     HPI Tony Harper presents for an acute sick visit for symptoms of UTI. He is accompanied by his mother. He symptoms started 2 days ago. He is having frequency, urgency, gross hematuria with clots. Decreased urine output, low back and flank pain. Urethral pruritus.  UA urine dip performed in office--positive for nitrites moderate RBCs and small leukocytes.  Urine was also positive for glucose.  Checked random fingerstick glucose level which was 129. Then check A1C which was normal at 4.7. per mother, patient has a significant family history of type 1 and type 2 diabetes.    Current Medication:  Outpatient Encounter Medications as of 04/15/2021  Medication Sig   carbamazepine (CARBATROL) 200 MG 12 hr capsule TAKE 1 CAPSULE(200 MG) BY MOUTH TWICE DAILY   ciprofloxacin (CIPRO) 250 MG tablet Take 1 tablet (250 mg total) by mouth 2 (two) times daily for 5 days.   phenazopyridine (PYRIDIUM) 200 MG tablet Take 1 tablet (200 mg total) by mouth 3 (three) times daily as needed for pain.   phenytoin (DILANTIN) 100 MG ER capsule TAKE 1 CAPSULE BY MOUTH EVERY MORNING AND 2 CAPSULES BY MOUTH EVERY NIGHT AT BEDTIME   [DISCONTINUED] ciprofloxacin-dexamethasone (CIPRODEX) OTIC suspension Place 4 drops into the right ear 2 (two) times daily. (Patient not taking: Reported on 04/15/2021)   [DISCONTINUED] ofloxacin (FLOXIN) 0.3 % OTIC solution Place 5 drops into the right ear daily. (Patient not taking: Reported on 04/15/2021)   [DISCONTINUED] predniSONE (DELTASONE) 10 MG tablet Take 1 tablet three times a day with a meal  for three for three days, take 1 tablet by twice daily with a meal for 3 days, take 1 tablet once daily with a meal for 3 days (Patient not taking: Reported on 04/15/2021)   No facility-administered encounter medications on file as of 04/15/2021.      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  HENT:  Negative for congestion, rhinorrhea, sneezing and sore throat.   Eyes:  Negative for redness.  Respiratory:  Negative for cough, chest tightness and shortness of breath.   Cardiovascular:  Negative for chest pain and palpitations.  Gastrointestinal:  Negative for abdominal pain, constipation, diarrhea, nausea and vomiting.  Genitourinary:  Positive for dysuria, frequency and urgency.       Urethral itchiness per patient.   Musculoskeletal:  Negative for arthralgias, back pain, joint swelling and neck pain.  Skin:  Negative for rash.  Neurological: Negative.  Negative for tremors and numbness.  Hematological:  Negative for adenopathy. Does not bruise/bleed  easily.  Psychiatric/Behavioral:  Negative for behavioral problems (Depression), sleep disturbance and suicidal ideas. The patient is not nervous/anxious.    Physical Exam Vitals reviewed.  Constitutional:      General: He is not in acute distress.    Appearance: Normal appearance. He is normal weight. He is not ill-appearing.  HENT:     Head: Normocephalic and atraumatic.  Cardiovascular:     Rate and Rhythm: Normal rate and regular rhythm.  Pulmonary:     Effort: Pulmonary effort is normal. No  respiratory distress.  Abdominal:     Tenderness: There is abdominal tenderness in the suprapubic area. There is no right CVA tenderness or left CVA tenderness.  Musculoskeletal:        General: Deformity present.  Skin:    Capillary Refill: Capillary refill takes less than 2 seconds.  Neurological:     Mental Status: He is alert and oriented to person, place, and time.  Psychiatric:        Mood and Affect: Mood normal.        Behavior: Behavior normal.     Assessment/Plan: 1. Acute cystitis with hematuria Urine sent for culture, empiric treatment with ciprofloxacin prescribed. Symptomatic treatment with pyridium prescribed.  - phenazopyridine (PYRIDIUM) 200 MG tablet; Take 1 tablet (200 mg total) by mouth 3 (three) times daily as needed for pain.  Dispense: 10 tablet; Refill: 0 - ciprofloxacin (CIPRO) 250 MG tablet; Take 1 tablet (250 mg total) by mouth 2 (two) times daily for 5 days.  Dispense: 10 tablet; Refill: 0 - CULTURE, URINE COMPREHENSIVE  2. Glycosuria Glucose present in urine, random serum glucose checked in office, 129. A1C checked in office and is normal at 4.7 - POCT glycosylated hemoglobin (Hb A1C) - POCT Glucose (CBG)  3. Dysuria Urinalysis done in office, results concerning for UTI, urine culture sent.  - POCT Urinalysis Dipstick   General Counseling: [name] verbalizes understanding of the findings of todays visit and agrees with plan of treatment. I have discussed any further diagnostic evaluation that may be needed or ordered today. We also reviewed his medications today. he has been encouraged to call the office with any questions or concerns that should arise related to todays visit.   Orders Placed This Encounter  Procedures   POCT glycosylated hemoglobin (Hb A1C)   POCT Glucose (CBG)    Meds ordered this encounter  Medications   phenazopyridine (PYRIDIUM) 200 MG tablet    Sig: Take 1 tablet (200 mg total) by mouth 3 (three) times daily as needed for  pain.    Dispense:  10 tablet    Refill:  0   ciprofloxacin (CIPRO) 250 MG tablet    Sig: Take 1 tablet (250 mg total) by mouth 2 (two) times daily for 5 days.    Dispense:  10 tablet    Refill:  0    Return if symptoms worsen or fail to improve.  [place] Controlled Substance Database was reviewed by me for overdose risk score (ORS)  Time spent:20 Minutes Time spent with patient included reviewing progress notes, labs, imaging studies, and discussing plan for follow up.   This patient was seen by Sallyanne Kuster, FNP-C in collaboration with Dr. Beverely Risen as a part of collaborative care agreement.  Leslieann Whisman R. Tedd Sias, MSN, FNP-C Internal Medicine

## 2021-04-18 LAB — CULTURE, URINE COMPREHENSIVE

## 2021-06-11 ENCOUNTER — Other Ambulatory Visit: Payer: Self-pay | Admitting: Internal Medicine

## 2021-06-11 NOTE — Telephone Encounter (Signed)
Med sent to pharmacy.

## 2021-06-11 NOTE — Telephone Encounter (Signed)
Ok to fill 

## 2021-07-16 ENCOUNTER — Encounter: Payer: Medicaid Other | Admitting: Nurse Practitioner

## 2021-07-16 DIAGNOSIS — Z0289 Encounter for other administrative examinations: Secondary | ICD-10-CM

## 2021-07-25 ENCOUNTER — Other Ambulatory Visit: Payer: Self-pay | Admitting: Internal Medicine

## 2021-07-25 DIAGNOSIS — G40909 Epilepsy, unspecified, not intractable, without status epilepticus: Secondary | ICD-10-CM

## 2021-09-07 ENCOUNTER — Other Ambulatory Visit: Payer: Self-pay | Admitting: Nurse Practitioner

## 2021-10-14 ENCOUNTER — Other Ambulatory Visit: Payer: Self-pay | Admitting: Nurse Practitioner

## 2021-11-12 ENCOUNTER — Other Ambulatory Visit: Payer: Self-pay | Admitting: Nurse Practitioner

## 2021-11-13 ENCOUNTER — Other Ambulatory Visit: Payer: Self-pay | Admitting: Nurse Practitioner

## 2021-12-14 ENCOUNTER — Other Ambulatory Visit: Payer: Self-pay | Admitting: Nurse Practitioner

## 2021-12-18 ENCOUNTER — Encounter: Payer: Self-pay | Admitting: Nurse Practitioner

## 2021-12-18 ENCOUNTER — Ambulatory Visit: Payer: Medicaid Other | Admitting: Nurse Practitioner

## 2021-12-18 VITALS — BP 123/83 | HR 90 | Temp 98.2°F | Resp 16 | Ht 65.5 in | Wt 111.0 lb

## 2021-12-18 DIAGNOSIS — G40909 Epilepsy, unspecified, not intractable, without status epilepticus: Secondary | ICD-10-CM

## 2021-12-18 DIAGNOSIS — G809 Cerebral palsy, unspecified: Secondary | ICD-10-CM

## 2021-12-18 DIAGNOSIS — Z9189 Other specified personal risk factors, not elsewhere classified: Secondary | ICD-10-CM | POA: Diagnosis not present

## 2021-12-18 MED ORDER — CARBAMAZEPINE ER 200 MG PO CP12: ORAL_CAPSULE | ORAL | 3 refills | Status: DC

## 2021-12-18 MED ORDER — PHENYTOIN SODIUM EXTENDED 100 MG PO CAPS: ORAL_CAPSULE | ORAL | 3 refills | Status: DC

## 2021-12-18 NOTE — Progress Notes (Signed)
[facility] ?[address] ?[place], [place] [address] ? ?Internal MEDICINE  ?Office Visit Note ? ?Patient Name: Tony Harper ? [id number]  ?[id number] ? ?Date of Service: 12/18/2021 ? ?Chief Complaint  ?Patient presents with  ? Follow-up  ? Medication Refill  ? ? ?HPI ?Tony Harper presents for a follow up visit for seizure disorder and medication refills. He is well overdue for an annual physical exam and routine labs. Refills will be ordered. He is accompanied by his father to the office visit today. His previous office visit was in august for an acute visit for UTI. He has been scheduled for his physical exam 4 times in the past 3 years and has either not shown up for the appointment or cancelled the appointment.  ?He is intent on not participating in the office visit today and is shaking his head "no" to every question asked including when an annual physical exam visit was mentioned. He is young and appears healthy upon observation although slightly underweight but has gained 8 lbs since august 2022. His blood pressure and other vital signs are stable and within normal limits. His developmental age/cognitive age seems to be that of an adolescent but noted during previos office visit that he is able to communicate well if he wants to.  ?He was making an inappropriate gesture with his hand that he made up himself that was explained by his father. The patient is well-taken-care-of by his parents but did not want to be in the clinic today based on his behavior.  ? ? ?Current Medication: ?Outpatient Encounter Medications as of 12/18/2021  ?Medication Sig  ? [DISCONTINUED] carbamazepine (CARBATROL) 200 MG 12 hr capsule TAKE ONE CAPSULE BY MOUTH TWICE DAILY  ? [DISCONTINUED] phenazopyridine (PYRIDIUM) 200 MG tablet Take 1 tablet (200 mg total) by mouth 3 (three) times daily as needed for pain.  ? [DISCONTINUED] phenytoin (DILANTIN) 100 MG ER capsule TAKE 1 CAPSULE BY MOUTH EVERY MORNING AND 2 CAPSULES EVERY NIGHT AT  BEDTIME  ? carbamazepine (CARBATROL) 200 MG 12 hr capsule TAKE ONE CAPSULE BY MOUTH TWICE DAILY  ? phenytoin (DILANTIN) 100 MG ER capsule TAKE 2 CAPSULE BY MOUTH EVERY MORNING AND 1 CAPSULES EVERY NIGHT AT BEDTIME  ? ?No facility-administered encounter medications on file as of 12/18/2021.  ? ? ?Surgical History: ?History reviewed. No pertinent surgical history. ? ?Medical History: ?Past Medical History:  ?Diagnosis Date  ? Cerebral palsy (HCC)   ? 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 right side hemiparesis  ? Seizure disorder (HCC)   ? ? ?Family History: ?Family History  ?Problem Relation Age of Onset  ? Hypertension Mother   ? Asthma Mother   ? Asthma Sister   ? Hypertension Maternal Aunt   ? Hypertension Maternal Grandmother   ? Stroke Maternal Grandmother   ? Cancer Maternal Grandfather   ? Stroke Paternal Grandmother   ? ? ?Social History  ? ?Socioeconomic History  ? Marital status: Single  ?  Spouse name: Not on file  ? Number of children: Not on file  ? Years of education: Not on file  ? Highest education level: Not on file  ?Occupational History  ? Not on file  ?Tobacco Use  ? Smoking status: Never  ? Smokeless tobacco: Never  ?Vaping Use  ? Vaping Use: Never used  ?Substance and Sexual Activity  ? Alcohol use: Never  ? Drug use: Never  ? Sexual activity: Not on  file  ?Other Topics Concern  ? Not on file  ?Social History Narrative  ? Not on file  ? ?Social Determinants of Health  ? ?Financial Resource Strain: Not on file  ?Food Insecurity: Not on file  ?Transportation Needs: Not on file  ?Physical Activity: Not on file  ?Stress: Not on file  ?Social Connections: Not on file  ?Intimate Partner Violence: Not on file  ? ? ? ? ?Review of Systems  ?Constitutional:  Negative for chills, fatigue and unexpected weight change.  ?HENT:  Negative for congestion, postnasal drip, rhinorrhea, sneezing and sore throat.   ?Eyes:  Negative for redness.  ?Respiratory:   Negative for cough, chest tightness and shortness of breath.   ?Cardiovascular:  Negative for chest pain and palpitations.  ?Gastrointestinal:  Negative for abdominal pain, constipation, diarrhea, nausea and vomiting.  ?Genitourinary:  Negative for dysuria and frequency.  ?Musculoskeletal:  Negative for arthralgias, back pain, joint swelling and neck pain.  ?Skin:  Negative for rash.  ?Neurological:  Negative for tremors and numbness.  ?Hematological:  Negative for adenopathy. Does not bruise/bleed easily.  ?Psychiatric/Behavioral:  Negative for behavioral problems (Depression), sleep disturbance and suicidal ideas. The patient is not nervous/anxious.   ? ?Vital Signs: ?BP 123/83   Pulse 90   Temp 98.2 ?F (36.8 ?C)   Resp 16   Ht 5' 5.5" (1.664 m)   Wt 111 lb (50.3 kg)   SpO2 99%   BMI 18.19 kg/m?  ? ? ?Physical Exam ?Vitals reviewed.  ?Constitutional:   ?   General: He is not in acute distress. ?   Appearance: He is well-developed. He is not ill-appearing or diaphoretic.  ?HENT:  ?   Head: Normocephalic and atraumatic.  ?Eyes:  ?   Pupils: Pupils are equal, round, and reactive to light.  ?Neck:  ?   Thyroid: No thyromegaly.  ?   Vascular: No JVD.  ?   Trachea: No tracheal deviation.  ?Cardiovascular:  ?   Rate and Rhythm: Normal rate and regular rhythm.  ?Pulmonary:  ?   Effort: Pulmonary effort is normal. No respiratory distress.  ?Neurological:  ?   Mental Status: He is alert. Mental status is at baseline.  ?   Cranial Nerves: No cranial nerve deficit.  ? ? ? ? ? ?Assessment/Plan: ?1. Seizure disorder (HCC) ?Seizures are common in persons with cerebral palsy. He has been on these medications for a long time. Needing refills, orders sent to pharmacy.  ?- phenytoin (DILANTIN) 100 MG ER capsule; TAKE 2 CAPSULE BY MOUTH EVERY MORNING AND 1 CAPSULES EVERY NIGHT AT BEDTIME  Dispense: 270 capsule; Refill: 3 ?- carbamazepine (CARBATROL) 200 MG 12 hr capsule; TAKE ONE CAPSULE BY MOUTH TWICE DAILY  Dispense: 180  capsule; Refill: 3 ? ?2. Cerebral palsy, unspecified type (HCC) ?His parents are his caregivers. He is young and on seizure medications. He appears to be well, slightly underweight but has gained 8 lbs since august. It is important to at least get annual labs drawn especially with long term use of phenytoin and tegretol. ? ?3. Potential for lack of continuity of care ?Lack of continuity in the sense of no routine annual physical exam or routine labs. This is most likely due to a combination of factors to include patient's developmental and cognitive age and function as well as his physical ability to get to the clinic. Patient and his father were encouraged to schedule a routine follow up when they are ready. Lab requisition for routine labs  was provided. Encouraged the patient's father to take him to get his labs drawn on a day that is good for them.  ? ? ?General Counseling: jamie belger understanding of the findings of todays visit and agrees with plan of treatment. I have discussed any further diagnostic evaluation that may be needed or ordered today. We also reviewed his medications today. he has been encouraged to call the office with any questions or concerns that should arise related to todays visit. ? ? ? ?No orders of the defined types were placed in this encounter. ? ? ?Meds ordered this encounter  ?Medications  ? phenytoin (DILANTIN) 100 MG ER capsule  ?  Sig: TAKE 2 CAPSULE BY MOUTH EVERY MORNING AND 1 CAPSULES EVERY NIGHT AT BEDTIME  ?  Dispense:  270 capsule  ?  Refill:  3  ? carbamazepine (CARBATROL) 200 MG 12 hr capsule  ?  Sig: TAKE ONE CAPSULE BY MOUTH TWICE DAILY  ?  Dispense:  180 capsule  ?  Refill:  3  ?  For future refills when they are due  ? ? ?Return for CPE earliest available. . ? ? ?Total time spent:30 Minutes ?Time spent includes review of chart, medications, test results, and follow up plan with the patient.  ? ?[place] Controlled Substance Database was reviewed by me. ? ?This patient  was seen by Sallyanne Kuster, FNP-C in collaboration with Dr. Beverely Risen as a part of collaborative care agreement. ? ? ?Gurveer Colucci R. Tedd Sias, MSN, FNP-C ?Internal medicine  ?

## 2021-12-22 ENCOUNTER — Encounter: Payer: Self-pay | Admitting: Nurse Practitioner

## 2022-01-05 ENCOUNTER — Encounter: Payer: Medicaid Other | Admitting: Nurse Practitioner

## 2022-10-28 ENCOUNTER — Ambulatory Visit: Payer: Medicaid Other | Admitting: Physician Assistant

## 2022-12-11 ENCOUNTER — Other Ambulatory Visit: Payer: Self-pay | Admitting: Nurse Practitioner

## 2022-12-11 DIAGNOSIS — G40909 Epilepsy, unspecified, not intractable, without status epilepticus: Secondary | ICD-10-CM

## 2022-12-14 NOTE — Telephone Encounter (Signed)
Pt last seen 4/23 no next

## 2022-12-28 ENCOUNTER — Ambulatory Visit (INDEPENDENT_AMBULATORY_CARE_PROVIDER_SITE_OTHER): Payer: Medicaid Other | Admitting: Family Medicine

## 2022-12-28 ENCOUNTER — Encounter: Payer: Self-pay | Admitting: Family Medicine

## 2022-12-28 VITALS — BP 119/78 | HR 81 | Resp 16 | Ht 66.0 in | Wt 115.2 lb

## 2022-12-28 DIAGNOSIS — G40909 Epilepsy, unspecified, not intractable, without status epilepticus: Secondary | ICD-10-CM

## 2022-12-28 DIAGNOSIS — Q233 Congenital mitral insufficiency: Secondary | ICD-10-CM

## 2022-12-28 DIAGNOSIS — Z0000 Encounter for general adult medical examination without abnormal findings: Secondary | ICD-10-CM

## 2022-12-28 DIAGNOSIS — G804 Ataxic cerebral palsy: Secondary | ICD-10-CM | POA: Diagnosis not present

## 2022-12-28 MED ORDER — PHENYTOIN SODIUM EXTENDED 100 MG PO CAPS: ORAL_CAPSULE | ORAL | 1 refills | Status: DC

## 2022-12-28 MED ORDER — CARBAMAZEPINE ER 200 MG PO CP12: ORAL_CAPSULE | ORAL | 1 refills | Status: DC

## 2022-12-28 NOTE — Assessment & Plan Note (Signed)
Chronic  Stable  Last seizure was in 2019  Continue phenytoin 200mg  in the AM and 100mg  at bed time along with carbazempine 200mg  twice daily  Will place referral to neurology for patient to establish care  Plan to follow up in 3 months for therapeutic levels and screening labs

## 2022-12-28 NOTE — Assessment & Plan Note (Signed)
Patient's mother declines HPV vaccination  Declines influenza vaccine

## 2022-12-28 NOTE — Assessment & Plan Note (Signed)
Congenital  Stable  Patient able to ambulate with assistance

## 2022-12-28 NOTE — Progress Notes (Signed)
I,Tony Harper,acting as a scribe for [name], MD.,have documented all relevant documentation on the behalf of Tony Ramp, MD,as directed by  Tony Ramp, MD while in the presence of Tony Ramp, MD.  New patient visit   Patient: Tony Harper   DOB: May 04, 1996   27 y.o. Male  MRN: [id number] Visit Date: 12/28/2022  Today's healthcare provider: [name], MD   Chief Complaint  Patient presents with   Establish Care   Subjective    Tony Harper is a 27 y.o. male who presents today as a new patient to establish care.  HPI  Seizure D/O Patient is here with both parents. Patient needs seizure medication refills  prescribed. Mother reports that he was released from [facility] when seizure frequency decreased  They will see patient if seizures become more frequent. Seizures are reportedly controlled. Last seizure was in Nov 2019  Mother voices concern for autism, pt is particular about how things are done   Mother reports right sided weakness   Has speech impediment stutters   Social Hx  Does not see any other specialist, they report he often is uncooperative with doctors appointments because he does not like having blood drawn and does not like breaks in his usual routine Lives with parents   Past Medical History:  Diagnosis Date   Cerebral palsy (HCC)    Cysts of right lower eyelid 02/16/2017   Added automatically from request for surgery [id number]     Added automatically from request for surgery [id number]   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 No pertinent surgical history. Family Status  Relation Name Status   Mother  (Not Specified)   Sister  (Not Specified)   Mat Aunt  (Not Specified)   MGM  (Not Specified)   MGF   (Not Specified)   PGM  (Not Specified)   Family History  Problem Relation Age of Onset   Hypertension Mother    Asthma Mother    Asthma Sister    Hypertension Maternal Aunt    Hypertension Maternal Grandmother    Stroke Maternal Grandmother    Cancer Maternal Grandfather    Stroke Paternal Grandmother    Social History   Socioeconomic History   Marital status: Single    Spouse name: Not on file   Number of children: Not on file   Years of education: Not on file   Highest education level: Not on file  Occupational History   Not on file  Tobacco Use   Smoking status: Never   Smokeless tobacco: Never  Vaping Use   Vaping Use: Never used  Substance and Sexual Activity   Alcohol use: Yes    Comment: Rarely 1 beer   Drug use: Never   Sexual activity: Not on file  Other Topics Concern   Not on file  Social History Narrative   Not on file   Social Determinants of Health   Financial Resource Strain: Not on file  Food Insecurity: Not on file  Transportation Needs: Not on file  Physical Activity: Not on file  Stress: Not on file  Social Connections: Not on file   Outpatient Medications Prior to Visit  Medication Sig   [DISCONTINUED] carbamazepine (CARBATROL) 200 MG 12 hr capsule TAKE ONE CAPSULE BY MOUTH  TWICE DAILY   [DISCONTINUED] phenytoin (DILANTIN) 100 MG ER capsule TAKE 2 CAPSULES BY MOUTH EVERY MORNING AND 1 CAPSULE EVERY NIGHT AT BEDTIME   No facility-administered medications prior to visit.   No Known Allergies   There is no immunization history on file for this patient.  Health Maintenance  Topic Date Due   COVID-19 Vaccine (1) Never done   HPV VACCINES (1 - Male 2-dose series) Never done   HIV Screening  Never done   Hepatitis C Screening  Never done   DTaP/Tdap/Td (1 - Tdap) Never done   INFLUENZA VACCINE  03/25/2023    Patient Care Team: Tony Ramp, MD as PCP - General (Family Medicine)  Review of Systems     Objective     BP 119/78 (BP Location: Left Arm, Patient Position: Sitting, Cuff Size: Normal)   Pulse 81   Resp 16   Ht 5\' 6"  (1.676 m)   Wt 115 lb 3.2 oz (52.3 kg)   BMI 18.59 kg/m    Physical Exam Vitals reviewed.  Constitutional:      General: He is not in acute distress.    Appearance: Normal appearance. He is not ill-appearing.  Pulmonary:     Effort: Pulmonary effort is normal. No respiratory distress.  Neurological:     Mental Status: He is alert. Mental status is at baseline.     Motor: Weakness and atrophy present. No tremor or seizure activity.     Gait: Gait abnormal.     Comments: Right upper and lower extremity weakness, residual of strokes  Right foot is everted at rest   Psychiatric:        Attention and Perception: Attention normal.        Behavior: Behavior is uncooperative.     Depression Screen    12/28/2022    2:33 PM 12/18/2021    8:33 AM 10/07/2020   11:34 AM 10/03/2019    4:22 PM  PHQ 2/9 Scores  PHQ - 2 Score 0 0 0 1   No results found for any visits on 12/28/22.  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) - Primary    Patient's parents deny hx of this or treatment         Nervous and Auditory   Seizure disorder (HCC)    Chronic  Stable  Last seizure was in 2019  Continue phenytoin 200mg  in the AM and 100mg  at bed time along with carbazempine 200mg  twice daily  Will place referral to neurology for patient to establish care  Plan to follow up in 3 months for therapeutic levels and screening labs        Relevant Medications   carbamazepine (CARBATROL) 200 MG 12 hr capsule   phenytoin (DILANTIN) 100 MG ER capsule   Other Relevant Orders   Ambulatory referral to Neurology   Cerebral palsy (HCC)    Congenital  Stable  Patient able to ambulate with assistance        Relevant Orders   Ambulatory referral to Neurology     Other   Healthcare  maintenance    Patient's mother declines HPV vaccination  Declines influenza vaccine         Return in about 3 months (around 03/30/2023) for phenytoin levels .       The entirety of the information documented in the History of Present Illness,  Review of Systems and Physical Exam were personally obtained by me. Portions of this information were initially documented by Hetty Ely, CMA. I, Tony Ramp, MD have reviewed the documentation above for thoroughness and accuracy.   Tony Ramp, MD  [facility] [phone] (phone) [phone] (fax)  [facility]

## 2022-12-28 NOTE — Assessment & Plan Note (Signed)
Patient's parents deny hx of this or treatment

## 2023-01-19 ENCOUNTER — Other Ambulatory Visit: Payer: Self-pay | Admitting: Family Medicine

## 2023-01-19 ENCOUNTER — Other Ambulatory Visit: Payer: Self-pay | Admitting: Nurse Practitioner

## 2023-01-19 DIAGNOSIS — G40909 Epilepsy, unspecified, not intractable, without status epilepticus: Secondary | ICD-10-CM

## 2023-01-19 NOTE — Telephone Encounter (Unsigned)
Copied from CRM [id number]. Topic: General - Other >> Jan 19, 2023  1:16 PM Everette C wrote: Reason for CRM: Medication Refill - Medication: carbamazepine (CARBATROL) 200 MG 12 hr capsule [[id number]]  Has the patient contacted their pharmacy? Yes.   (Agent: If no, request that the patient contact the pharmacy for the refill. If patient does not wish to contact the pharmacy document the reason why and proceed with request.) (Agent: If yes, when and what did the pharmacy advise?)  Preferred Pharmacy (with phone number or street name): [facility] [address] [place] - [address] [place] [address] Phone: [phone] Fax: [phone] Hours: Not open 24 hours   Has the patient been seen for an appointment in the last year OR does the patient have an upcoming appointment? Yes.    Agent: Please be advised that RX refills may take up to 3 business days. We ask that you follow-up with your pharmacy.

## 2023-01-20 NOTE — Telephone Encounter (Signed)
Requested Prescriptions  Pending Prescriptions Disp Refills   carbamazepine (CARBATROL) 200 MG 12 hr capsule 180 capsule 1    Sig: TAKE ONE CAPSULE BY MOUTH TWICE DAILY     Neurology:  Anticonvulsants - carbamazepine Failed - 01/19/2023  1:42 PM      Failed - AST in normal range and within 360 days    AST  Date Value Ref Range Status  07/22/2018 28 15 - 41 U/L Final         Failed - ALT in normal range and within 360 days    ALT  Date Value Ref Range Status  07/22/2018 12 0 - 44 U/L Final         Failed - Carbamazepine (serum) in normal range and within 360 days    Carbamazepine, Total  Date Value Ref Range Status  09/19/2018 2.7 (L) 4.0 - 12.0 ug/mL Final    Comment:             In conjunction with other antiepileptic drugs                                Therapeutic  4.0 -  8.0                                Toxicity     9.0 - 12.0                                    Carbamazepine alone                                Therapeutic  8.0 - 12.0                                 Detection Limit =  0.5                           <0.5 indicated None Detected    Carbamazepine (Tegretol), S  Date Value Ref Range Status  11/11/2020 5.5 4.0 - 12.0 ug/mL Final    Comment:             In conjunction with other antiepileptic drugs                                Therapeutic  4.0 -  8.0                                Toxicity     9.0 - 12.0                                    Carbamazepine alone                                Therapeutic  8.0 - 12.0  Detection Limit =  2.0                           <2.0 indicated None Detected          Failed - WBC in normal range and within 360 days    WBC  Date Value Ref Range Status  07/24/2018 5.8 4.0 - 10.5 K/uL Final         Failed - PLT in normal range and within 360 days    Platelets  Date Value Ref Range Status  07/24/2018 101 (L) 150 - 400 K/uL Final    Comment:    Immature Platelet Fraction may  be clinically indicated, consider ordering this additional test [id number]          Failed - HGB in normal range and within 360 days    Hemoglobin  Date Value Ref Range Status  07/24/2018 13.1 13.0 - 17.0 g/dL Final         Failed - Na in normal range and within 360 days    Sodium  Date Value Ref Range Status  07/24/2018 138 135 - 145 mmol/L Final         Failed - HCT in normal range and within 360 days    HCT  Date Value Ref Range Status  07/24/2018 38.6 (L) 39.0 - 52.0 % Final         Failed - Cr in normal range and within 360 days    Creatinine, Ser  Date Value Ref Range Status  07/24/2018 0.90 0.61 - 1.24 mg/dL Final         Passed - Completed PHQ-2 or PHQ-9 in the last 360 days      Passed - Valid encounter within last 12 months    Recent Outpatient Visits           3 weeks ago 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

## 2023-01-21 NOTE — Telephone Encounter (Signed)
Pt's mother called back saying he is  leaving for [facility] tomorrow for a week and only has 6 pills and he takes two pill a day.  He needs the refill today if possible  CB#  [phone]

## 2023-01-21 NOTE — Telephone Encounter (Signed)
Patient's mother called back and said that [facility].

## 2023-03-26 NOTE — Progress Notes (Deleted)
      Established patient visit   Patient: Tony Harper   DOB: August 19, 1996   27 y.o. Male  MRN: [id number] Visit Date: 04/01/2023  Today's healthcare provider: [name], MD   No chief complaint on file.  Subjective      ***  Medications: Outpatient Medications Prior to Visit  Medication Sig   carbamazepine (CARBATROL) 200 MG 12 hr capsule TAKE ONE CAPSULE BY MOUTH TWICE DAILY   phenytoin (DILANTIN) 100 MG ER capsule TAKE 2 CAPSULES BY MOUTH EVERY MORNING AND 1 CAPSULE EVERY NIGHT AT BEDTIME   No facility-administered medications prior to visit.    Review of Systems  {Insert previous labs (optional):23779}  {See past labs  Heme  Chem  Endocrine  Serology  Results Review (optional):1}   Objective    There were no vitals taken for this visit. {Insert last BP/Wt (optional):23777}  {See vitals history (optional):1}  Physical Exam  ***  No results found for any visits on 04/01/23.  Assessment & Plan     Problem List Items Addressed This Visit   None    No follow-ups on file.         [name], MD  [facility] [phone] (phone) [phone] (fax)  [facility]

## 2023-03-30 ENCOUNTER — Ambulatory Visit: Payer: Medicaid Other | Admitting: Family Medicine

## 2023-04-01 ENCOUNTER — Ambulatory Visit: Payer: Medicaid Other | Admitting: Family Medicine

## 2023-04-03 NOTE — Progress Notes (Unsigned)
Established patient visit  Patient: Tony Harper   DOB: 23-Feb-1996   27 y.o. Male  MRN: [id number] Visit Date: 04/06/2023  Today's healthcare provider: Debera Lat, PA-C   No chief complaint on file.  Subjective    HPI  *** Discussed the use of AI scribe software for clinical note transcription with the patient, who gave verbal consent to proceed.  History of Present Illness               12/28/2022    2:33 PM 12/18/2021    8:33 AM 10/07/2020   11:34 AM  Depression screen PHQ 2/9  Decreased Interest 0 0 0  Down, Depressed, Hopeless 0 0 0  PHQ - 2 Score 0 0 0       No data to display          Medications: Outpatient Medications Prior to Visit  Medication Sig   carbamazepine (CARBATROL) 200 MG 12 hr capsule TAKE ONE CAPSULE BY MOUTH TWICE DAILY   phenytoin (DILANTIN) 100 MG ER capsule TAKE 2 CAPSULES BY MOUTH EVERY MORNING AND 1 CAPSULE EVERY NIGHT AT BEDTIME   No facility-administered medications prior to visit.    Review of Systems  All other systems reviewed and are negative.  Except see HPI   {Insert previous labs (optional):23779} {See past labs  Heme  Chem  Endocrine  Serology  Results Review (optional):1}   Objective    There were no vitals taken for this visit. {Insert last BP/Wt (optional):23777}{See vitals history (optional):1}   Physical Exam Vitals reviewed.  Constitutional:      General: He is not in acute distress.    Appearance: Normal appearance. He is not diaphoretic.  HENT:     Head: Normocephalic and atraumatic.  Eyes:     General: No scleral icterus.    Conjunctiva/sclera: Conjunctivae normal.  Cardiovascular:     Rate and Rhythm: Normal rate and regular rhythm.     Pulses: Normal pulses.     Heart sounds: Normal heart sounds. No murmur heard. Pulmonary:     Effort: Pulmonary effort is normal. No respiratory distress.     Breath sounds: Normal breath sounds. No wheezing or rhonchi.  Musculoskeletal:     Cervical  back: Neck supple.     Right lower leg: No edema.     Left lower leg: No edema.  Lymphadenopathy:     Cervical: No cervical adenopathy.  Skin:    General: Skin is warm and dry.     Findings: No rash.  Neurological:     Mental Status: He is alert and oriented to person, place, and time. Mental status is at baseline.  Psychiatric:        Mood and Affect: Mood normal.        Behavior: Behavior normal.      No results found for any visits on 04/06/23.  Assessment & Plan      No follow-ups on file.     The patient was advised to call back or seek an in-person evaluation if the symptoms worsen or if the condition fails to improve as anticipated.  I discussed the assessment and treatment plan with the patient. The patient was provided an opportunity to ask questions and all were answered. The patient agreed with the plan and demonstrated an understanding of the instructions.  I, Debera Lat, PA-C have reviewed all documentation for this visit. The documentation on  04/06/23 for the exam, diagnosis, procedures, and orders are all accurate and  complete.  Debera Lat, [facility], MMS [facility] [phone] (phone) [phone] (fax)  [facility]

## 2023-04-06 ENCOUNTER — Encounter: Payer: Self-pay | Admitting: Physician Assistant

## 2023-04-06 ENCOUNTER — Ambulatory Visit (INDEPENDENT_AMBULATORY_CARE_PROVIDER_SITE_OTHER): Payer: MEDICAID | Admitting: Physician Assistant

## 2023-04-06 VITALS — BP 123/93 | HR 99 | Ht 65.0 in | Wt 116.5 lb

## 2023-04-06 DIAGNOSIS — Z5181 Encounter for therapeutic drug level monitoring: Secondary | ICD-10-CM | POA: Diagnosis not present

## 2023-04-06 DIAGNOSIS — G804 Ataxic cerebral palsy: Secondary | ICD-10-CM

## 2023-04-06 DIAGNOSIS — G40909 Epilepsy, unspecified, not intractable, without status epilepticus: Secondary | ICD-10-CM

## 2023-04-06 DIAGNOSIS — F32A Depression, unspecified: Secondary | ICD-10-CM

## 2023-04-06 DIAGNOSIS — F419 Anxiety disorder, unspecified: Secondary | ICD-10-CM

## 2023-04-06 DIAGNOSIS — Z604 Social exclusion and rejection: Secondary | ICD-10-CM

## 2023-04-08 DIAGNOSIS — Z604 Social exclusion and rejection: Secondary | ICD-10-CM | POA: Insufficient documentation

## 2023-04-08 DIAGNOSIS — F32A Depression, unspecified: Secondary | ICD-10-CM | POA: Insufficient documentation

## 2023-04-08 DIAGNOSIS — Z7689 Persons encountering health services in other specified circumstances: Secondary | ICD-10-CM | POA: Insufficient documentation

## 2023-04-12 ENCOUNTER — Telehealth: Payer: Self-pay

## 2023-04-12 NOTE — Telephone Encounter (Signed)
-----   Message from Debera Lat sent at 04/12/2023  7:57 AM EDT -----  all labs are stable for patient

## 2023-04-12 NOTE — Progress Notes (Signed)
all labs are stable for patient

## 2023-05-04 ENCOUNTER — Ambulatory Visit: Payer: MEDICAID | Admitting: Physician Assistant

## 2023-07-07 ENCOUNTER — Other Ambulatory Visit: Payer: Self-pay | Admitting: Family Medicine

## 2023-07-07 DIAGNOSIS — G40909 Epilepsy, unspecified, not intractable, without status epilepticus: Secondary | ICD-10-CM

## 2023-07-08 NOTE — Telephone Encounter (Signed)
Requested medication (s) are due for refill today:   Provider to review  Requested medication (s) are on the active medication list:   Yes  Future visit scheduled:   No   Last ordered: 12/28/2022 #270, 1 refill  Non delegated refill    Requested Prescriptions  Pending Prescriptions Disp Refills   phenytoin (DILANTIN) 100 MG ER capsule [Pharmacy Med Name: PHENYTOIN SOD EXT 100 MG CAP] 270 capsule 1    Sig: TAKE 2 CAPSULES BY MOUTH EVERY MORNING AND 1 CAPSULE EVERY NIGHT AT BEDTIME     Not Delegated - Neurology:  Anticonvulsants - phenytoin Failed - 07/07/2023  2:26 AM      Failed - This refill cannot be delegated      Passed - ALT in normal range and within 360 days    ALT  Date Value Ref Range Status  04/07/2023 22 0 - 44 IU/L Final         Passed - AST in normal range and within 360 days    AST  Date Value Ref Range Status  04/07/2023 17 0 - 40 IU/L Final         Passed - HGB in normal range and within 360 days    Hemoglobin  Date Value Ref Range Status  04/07/2023 17.1 13.0 - 17.7 g/dL Final         Passed - HCT in normal range and within 360 days    Hematocrit  Date Value Ref Range Status  04/07/2023 50.1 37.5 - 51.0 % Final         Passed - PLT in normal range and within 360 days    Platelets  Date Value Ref Range Status  04/07/2023 220 150 - 450 x10E3/uL Final         Passed - WBC in normal range and within 360 days    WBC  Date Value Ref Range Status  04/07/2023 5.2 3.4 - 10.8 x10E3/uL Final  07/24/2018 5.8 4.0 - 10.5 K/uL Final         Passed - Phenytoin (serum) in normal range and within 360 days    Phenytoin (Dilantin), Serum  Date Value Ref Range Status  04/07/2023 10.5 10.0 - 20.0 ug/mL Final    Comment:                                    Detection Limit =  0.8                           <0.8 Indicates None Detected          Passed - Completed PHQ-2 or PHQ-9 in the last 360 days      Passed - Patient is not pregnant      Passed - Valid  encounter within last 12 months    Recent Outpatient Visits           3 months ago Encounter for therapeutic drug monitoring   [facility] [place], [place], PA-C   6 months ago 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

## 2023-07-26 ENCOUNTER — Other Ambulatory Visit: Payer: Self-pay | Admitting: Family Medicine

## 2023-07-26 DIAGNOSIS — G40909 Epilepsy, unspecified, not intractable, without status epilepticus: Secondary | ICD-10-CM

## 2023-07-28 NOTE — Telephone Encounter (Signed)
Provider aware of lab values Requested Prescriptions  Pending Prescriptions Disp Refills   carbamazepine (CARBATROL) 200 MG 12 hr capsule [Pharmacy Med Name: CARBAMAZEPINE ER 200 MG CAP] 180 capsule 1    Sig: TAKE 1 CAPSULE BY MOUTH TWICE A DAY     Neurology:  Anticonvulsants - carbamazepine Failed - 07/26/2023  1:14 AM      Failed - Carbamazepine (serum) in normal range and within 360 days    Carbamazepine, Total  Date Value Ref Range Status  09/19/2018 2.7 (L) 4.0 - 12.0 ug/mL Final    Comment:             In conjunction with other antiepileptic drugs                                Therapeutic  4.0 -  8.0                                Toxicity     9.0 - 12.0                                    Carbamazepine alone                                Therapeutic  8.0 - 12.0                                 Detection Limit =  0.5                           <0.5 indicated None Detected    Carbamazepine (Tegretol), S  Date Value Ref Range Status  04/07/2023 3.9 (L) 4.0 - 12.0 ug/mL Final    Comment:             In conjunction with other antiepileptic drugs                                Therapeutic  4.0 -  8.0                                Toxicity     9.0 - 12.0                                    Carbamazepine alone                                Therapeutic  8.0 - 12.0                                 Detection Limit =  2.0                           <2.0 indicates None Detected  Passed - AST in normal range and within 360 days    AST  Date Value Ref Range Status  04/07/2023 17 0 - 40 IU/L Final         Passed - ALT in normal range and within 360 days    ALT  Date Value Ref Range Status  04/07/2023 22 0 - 44 IU/L Final         Passed - WBC in normal range and within 360 days    WBC  Date Value Ref Range Status  04/07/2023 5.2 3.4 - 10.8 x10E3/uL Final  07/24/2018 5.8 4.0 - 10.5 K/uL Final         Passed - PLT in normal range and within 360 days    Platelets   Date Value Ref Range Status  04/07/2023 220 150 - 450 x10E3/uL Final         Passed - HGB in normal range and within 360 days    Hemoglobin  Date Value Ref Range Status  04/07/2023 17.1 13.0 - 17.7 g/dL Final         Passed - Na in normal range and within 360 days    Sodium  Date Value Ref Range Status  04/07/2023 139 134 - 144 mmol/L Final         Passed - HCT in normal range and within 360 days    Hematocrit  Date Value Ref Range Status  04/07/2023 50.1 37.5 - 51.0 % Final         Passed - Cr in normal range and within 360 days    Creatinine, Ser  Date Value Ref Range Status  04/07/2023 1.15 0.76 - 1.27 mg/dL Final         Passed - Completed PHQ-2 or PHQ-9 in the last 360 days      Passed - Valid encounter within last 12 months    Recent Outpatient Visits           3 months ago Encounter for therapeutic drug monitoring   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

## 2023-08-02 ENCOUNTER — Encounter: Payer: Self-pay | Admitting: Family Medicine

## 2023-08-02 ENCOUNTER — Ambulatory Visit: Payer: MEDICAID | Admitting: Family Medicine

## 2023-08-02 VITALS — BP 124/80 | HR 92 | Temp 97.6°F | Ht 65.0 in | Wt 119.0 lb

## 2023-08-02 DIAGNOSIS — J302 Other seasonal allergic rhinitis: Secondary | ICD-10-CM | POA: Diagnosis not present

## 2023-08-02 DIAGNOSIS — J0140 Acute pansinusitis, unspecified: Secondary | ICD-10-CM | POA: Diagnosis not present

## 2023-08-02 DIAGNOSIS — G40909 Epilepsy, unspecified, not intractable, without status epilepticus: Secondary | ICD-10-CM

## 2023-08-02 MED ORDER — HYDROCOD POLI-CHLORPHE POLI ER 10-8 MG/5ML PO SUER: 5.0000 mL | Freq: Every evening | ORAL | 0 refills | Status: DC | PRN

## 2023-08-02 MED ORDER — AZITHROMYCIN 250 MG PO TABS: ORAL_TABLET | ORAL | 0 refills | Status: AC

## 2023-08-02 MED ORDER — CARBAMAZEPINE ER 200 MG PO CP12
ORAL_CAPSULE | ORAL | 0 refills | Status: DC
Start: 2023-08-02 — End: 2023-11-03

## 2023-08-02 MED ORDER — BENZONATATE 200 MG PO CAPS: 200.0000 mg | ORAL_CAPSULE | Freq: Two times a day (BID) | ORAL | 0 refills | Status: DC | PRN

## 2023-08-02 NOTE — Assessment & Plan Note (Signed)
Chronic, stable Request for refills

## 2023-08-02 NOTE — Progress Notes (Addendum)
Established patient visit   Patient: Tony Harper   DOB: [birth year]   27 y.o. Male  MRN: [id number] Visit Date: 08/02/2023  Today's healthcare provider: Jacky Kindle, FNP  Introduced to nurse practitioner role and practice setting.  All questions answered.  Discussed provider/patient relationship and expectations.  Chief Complaint  Patient presents with   coughing    Coughing w/ green mucus, nose drip, sneezing--1 week. Denied fever.   Subjective    HPI HPI     coughing    Additional comments: Coughing w/ green mucus, nose drip, sneezing--1 week. Denied fever.      Last edited by Shelly Bombard, CMA on 08/02/2023  3:40 PM.      Medications: Outpatient Medications Prior to Visit  Medication Sig   phenytoin (DILANTIN) 100 MG ER capsule TAKE 2 CAPSULES BY MOUTH EVERY MORNING AND 1 CAPSULE EVERY NIGHT AT BEDTIME   [DISCONTINUED] carbamazepine (CARBATROL) 200 MG 12 hr capsule TAKE 1 CAPSULE BY MOUTH TWICE A DAY   No facility-administered medications prior to visit.    Review of Systems Last CBC Lab Results  Component Value Date   WBC 5.2 04/07/2023   HGB 17.1 04/07/2023   HCT 50.1 04/07/2023   MCV 88 04/07/2023   MCH 30.2 04/07/2023   RDW 12.5 04/07/2023   PLT 220 04/07/2023   Last metabolic panel Lab Results  Component Value Date   GLUCOSE 80 04/07/2023   NA 139 04/07/2023   K 4.6 04/07/2023   CL 99 04/07/2023   CO2 25 04/07/2023   BUN 14 04/07/2023   CREATININE 1.15 04/07/2023   EGFR 91 04/07/2023   CALCIUM 9.9 04/07/2023   PROT 7.5 04/07/2023   ALBUMIN 4.8 04/07/2023   LABGLOB 2.7 04/07/2023   BILITOT 0.6 04/07/2023   ALKPHOS 147 (H) 04/07/2023   AST 17 04/07/2023   ALT 22 04/07/2023   ANIONGAP 6 07/24/2018   Last lipids No results found for: "CHOL", "HDL", "LDLCALC", "LDLDIRECT", "TRIG", "CHOLHDL" Last hemoglobin A1c Lab Results  Component Value Date   HGBA1C 4.7 04/15/2021   Last thyroid functions Lab Results  Component Value Date    TSH 1.410 04/07/2023   Last vitamin D No results found for: "25OHVITD2", "25OHVITD3", "VD25OH" Last vitamin B12 and Folate No results found for: "VITAMINB12", "FOLATE"     Objective    BP 124/80   Pulse 92   Temp 97.6 F (36.4 C)   Ht 5\' 5"  (1.651 m)   Wt 119 lb (54 kg)   SpO2 97%   BMI 19.80 kg/m   BP Readings from Last 3 Encounters:  08/02/23 124/80  04/06/23 (!) 123/93  12/28/22 119/78   Wt Readings from Last 3 Encounters:  08/02/23 119 lb (54 kg)  04/06/23 116 lb 8 oz (52.8 kg)  12/28/22 115 lb 3.2 oz (52.3 kg)   SpO2 Readings from Last 3 Encounters:  08/02/23 97%  04/06/23 99%  12/18/21 99%   Physical Exam Vitals and nursing note reviewed.  Constitutional:      Appearance: Normal appearance. He is normal weight.  HENT:     Head: Normocephalic and atraumatic.     Right Ear: There is impacted cerumen.     Left Ear: Tympanic membrane, ear canal and external ear normal.     Nose: Congestion present.     Right Sinus: Maxillary sinus tenderness and frontal sinus tenderness present.     Left Sinus: Maxillary sinus tenderness and frontal sinus tenderness present.  Mouth/Throat:     Mouth: Mucous membranes are moist.     Pharynx: Oropharynx is clear.  Eyes:     Conjunctiva/sclera: Conjunctivae normal.  Cardiovascular:     Rate and Rhythm: Normal rate and regular rhythm.     Pulses: Normal pulses.     Heart sounds: Normal heart sounds.  Pulmonary:     Effort: Pulmonary effort is normal.     Breath sounds: Normal breath sounds.  Musculoskeletal:        General: Normal range of motion.     Cervical back: Normal range of motion.  Skin:    General: Skin is warm and dry.     Capillary Refill: Capillary refill takes less than 2 seconds.  Neurological:     General: No focal deficit present.     Mental Status: He is alert and oriented to person, place, and time. Mental status is at baseline.     No results found for any visits on 08/02/23.  Assessment &  Plan     Problem List Items Addressed This Visit       Respiratory   Acute non-recurrent pansinusitis - Primary    Acute symptoms; no rf covid or flu exposure per family reports Reports productive cough, rhinorrhea and sneezing  Denies fever Recommend supportive care with Zpak       Relevant Medications   azithromycin (ZITHROMAX) 250 MG tablet   benzonatate (TESSALON) 200 MG capsule   chlorpheniramine-HYDROcodone (TUSSIONEX) 10-8 MG/5ML   Seasonal allergic rhinitis    Acute, encouraged to use oral antihistamine and nasal steroid to assist R ear congestion noted; cleared with instrumentation Pt tolerated well        Nervous and Auditory   Seizure disorder (HCC)    Chronic, stable Request for refills       Relevant Medications   carbamazepine (CARBATROL) 200 MG 12 hr capsule   Return if symptoms worsen or fail to improve.     Leilani Merl, FNP, have reviewed all documentation for this visit. The documentation on 08/02/23 for the exam, diagnosis, procedures, and orders are all accurate and complete.  Jacky Kindle, FNP  [facility] [phone] (phone) [phone] (fax)  [facility]

## 2023-08-02 NOTE — Patient Instructions (Signed)
Some things that can make you feel better are: - Increased rest - Increasing Fluids - Acetaminophen / ibuprofen as needed for fever/pain.  - Salt water gargling, chloraseptic spray and throat lozenges - OTC pseudoephedrine.  - Mucinex.  - Saline sinus flushes or a neti pot.  - Humidifying the air.  

## 2023-08-02 NOTE — Assessment & Plan Note (Signed)
Acute, encouraged to use oral antihistamine and nasal steroid to assist R ear congestion noted; cleared with instrumentation Pt tolerated well

## 2023-08-02 NOTE — Assessment & Plan Note (Signed)
Acute symptoms; no rf covid or flu exposure per family reports Reports productive cough, rhinorrhea and sneezing  Denies fever Recommend supportive care with Zpak

## 2023-10-09 ENCOUNTER — Other Ambulatory Visit: Payer: Self-pay | Admitting: Physician Assistant

## 2023-10-09 DIAGNOSIS — G40909 Epilepsy, unspecified, not intractable, without status epilepticus: Secondary | ICD-10-CM

## 2023-10-11 NOTE — Telephone Encounter (Signed)
 Requested medication (s) are due for refill today - yes  Requested medication (s) are on the active medication list -yes  Future visit scheduled -no  Last refill: 07/12/23 #270  Notes to clinic: non delegated Rx  Requested Prescriptions  Pending Prescriptions Disp Refills   phenytoin (DILANTIN) 100 MG ER capsule [Pharmacy Med Name: PHENYTOIN SOD EXT 100 MG CAP] 270 capsule 0    Sig: TAKE 2 CAPSULES BY MOUTH EVERY MORNING AND 1 CAPSULE EVERY NIGHT AT BEDTIME     Not Delegated - Neurology:  Anticonvulsants - phenytoin Failed - 10/11/2023  9:27 AM      Failed - This refill cannot be delegated      Passed - ALT in normal range and within 360 days    ALT  Date Value Ref Range Status  04/07/2023 22 0 - 44 IU/L Final         Passed - AST in normal range and within 360 days    AST  Date Value Ref Range Status  04/07/2023 17 0 - 40 IU/L Final         Passed - HGB in normal range and within 360 days    Hemoglobin  Date Value Ref Range Status  04/07/2023 17.1 13.0 - 17.7 g/dL Final         Passed - HCT in normal range and within 360 days    Hematocrit  Date Value Ref Range Status  04/07/2023 50.1 37.5 - 51.0 % Final         Passed - PLT in normal range and within 360 days    Platelets  Date Value Ref Range Status  04/07/2023 220 150 - 450 x10E3/uL Final         Passed - WBC in normal range and within 360 days    WBC  Date Value Ref Range Status  04/07/2023 5.2 3.4 - 10.8 x10E3/uL Final  07/24/2018 5.8 4.0 - 10.5 K/uL Final         Passed - Phenytoin (serum) in normal range and within 360 days    Phenytoin (Dilantin), Serum  Date Value Ref Range Status  04/07/2023 10.5 10.0 - 20.0 ug/mL Final    Comment:                                    Detection Limit =  0.8                           <0.8 Indicates None Detected          Passed - Completed PHQ-2 or PHQ-9 in the last 360 days      Passed - Patient is not pregnant      Passed - Valid encounter within last 12  months    Recent Outpatient Visits           2 months ago Acute non-recurrent pansinusitis   [facility] Jacky Kindle, FNP   6 months ago Encounter for therapeutic drug monitoring   [facility] [place], [place], PA-C   9 months ago 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)    [facility] [place], Tawanna Cooler, MD                 Requested Prescriptions  Pending Prescriptions Disp Refills   phenytoin (DILANTIN) 100 MG ER capsule [Pharmacy Med Name: PHENYTOIN  SOD EXT 100 MG CAP] 270 capsule 0    Sig: TAKE 2 CAPSULES BY MOUTH EVERY MORNING AND 1 CAPSULE EVERY NIGHT AT BEDTIME     Not Delegated - Neurology:  Anticonvulsants - phenytoin Failed - 10/11/2023  9:27 AM      Failed - This refill cannot be delegated      Passed - ALT in normal range and within 360 days    ALT  Date Value Ref Range Status  04/07/2023 22 0 - 44 IU/L Final         Passed - AST in normal range and within 360 days    AST  Date Value Ref Range Status  04/07/2023 17 0 - 40 IU/L Final         Passed - HGB in normal range and within 360 days    Hemoglobin  Date Value Ref Range Status  04/07/2023 17.1 13.0 - 17.7 g/dL Final         Passed - HCT in normal range and within 360 days    Hematocrit  Date Value Ref Range Status  04/07/2023 50.1 37.5 - 51.0 % Final         Passed - PLT in normal range and within 360 days    Platelets  Date Value Ref Range Status  04/07/2023 220 150 - 450 x10E3/uL Final         Passed - WBC in normal range and within 360 days    WBC  Date Value Ref Range Status  04/07/2023 5.2 3.4 - 10.8 x10E3/uL Final  07/24/2018 5.8 4.0 - 10.5 K/uL Final         Passed - Phenytoin (serum) in normal range and within 360 days    Phenytoin (Dilantin), Serum  Date Value Ref Range Status  04/07/2023 10.5 10.0 - 20.0 ug/mL Final    Comment:                                    Detection Limit =  0.8                            <0.8 Indicates None Detected          Passed - Completed PHQ-2 or PHQ-9 in the last 360 days      Passed - Patient is not pregnant      Passed - Valid encounter within last 12 months    Recent Outpatient Visits           2 months ago Acute non-recurrent pansinusitis   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

## 2023-10-13 NOTE — Telephone Encounter (Signed)
 A courtesy refill. Strongly recommended to follow-up with Neurology, call to Dr Clelia Croft 's office before the next refill.

## 2023-11-03 ENCOUNTER — Telehealth: Payer: Self-pay | Admitting: Physician Assistant

## 2023-11-03 DIAGNOSIS — G40909 Epilepsy, unspecified, not intractable, without status epilepticus: Secondary | ICD-10-CM

## 2023-11-03 MED ORDER — CARBAMAZEPINE ER 200 MG PO CP12: ORAL_CAPSULE | ORAL | 0 refills | Status: AC

## 2023-11-03 NOTE — Telephone Encounter (Signed)
 [facility] is requesting refill carbamazepine (CARBATROL) 200 MG 12 hr capsule  Please advise

## 2024-01-13 ENCOUNTER — Other Ambulatory Visit: Payer: Self-pay | Admitting: Physician Assistant

## 2024-01-13 DIAGNOSIS — G40909 Epilepsy, unspecified, not intractable, without status epilepticus: Secondary | ICD-10-CM

## 2024-01-18 NOTE — Telephone Encounter (Signed)
 Pt should schedule a follow-up with neurology! Referral was placed in 8 /2024. It was a courtesy refill

## 2024-01-24 ENCOUNTER — Ambulatory Visit: Payer: Self-pay | Admitting: *Deleted

## 2024-01-24 NOTE — Telephone Encounter (Signed)
  Chief Complaint: Artemio Larry, mother, On DPR, called in for son.   He has special needs. He is having a fast heart rate intermittently that started a month ago.   I have the same thing with the same symptoms.   I have mitral valve prolapse and tricuspid valve regurgitation and my heart rate goes up and down with a pounding rate beat.   He is having the exact same symptoms for the past month  Symptoms: He c/o shortness of breath, chest pain and a pounding heart rate when this happens.   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  "It's just happening randomly over the past month".  It lasts about 3-4 minutes and then resolves.   He has special needs so he is not up and running around when this happens. Frequency: For the past month.   Having exact same symptoms as his mother. Pertinent Negatives: Patient denies having symptoms during the time of this call.   [name] had called in for herself and wanted to get him scheduled too. Disposition: [] ED /[] Urgent Care (no appt availability in office) / [x] Appointment(In office/virtual)/ []  [facility]/ [] Home Care/ [] Refused Recommended Disposition /[] [place] [facility]/ []  Follow-up with PCP Additional Notes: Since pt not having sympotms now and it's intermittent I scheduled him for 03/02/2024 at 1:20 with Janna Ostwalt, PA-C which was the next available appt.    I'm sending a message to see if he can be worked in sooner.   I also put him on the wait list in case of a cancellation.   I instructed [name] to take him to the ED if he has another episode so they can capture the episode should he have an episode before he is seen.

## 2024-01-24 NOTE — Telephone Encounter (Signed)
 Reason for Disposition  [1] Heart beating very rapidly (e.g., > 140 / minute) AND [2] not present now  (Exception: During exercise.)  Answer Assessment - Initial Assessment Questions 1. DESCRIPTION: "Please describe your heart rate or heartbeat that you are having" (e.g., fast/slow, regular/irregular, skipped or extra beats, "palpitations")     Tony Harper, mother calling in.    Son is having pounding heart rate, shortness of breath and chest pain for a month.   This is intermittent.    [name] also has this condition.   Mitral valve issues. 2. ONSET: "When did it start?" (Minutes, hours or days)      A month ago 3. DURATION: "How long does it last" (e.g., seconds, minutes, hours)     Intermittently for a few min. At a time.    3-4 min. 4. PATTERN "Does it come and go, or has it been constant since it started?"  "Does it get worse with exertion?"   "Are you feeling it now?"     He has special needs so he is not up running around when this happens.  He is having the exact same symptoms as I do. 5. TAP: "Using your hand, can you tap out what you are feeling on a chair or table in front of you, so that I can hear?" (Note: not all patients can do this)       Not asked 6. HEART RATE: "Can you tell me your heart rate?" "How many beats in 15 seconds?"  (Note: not all patients can do this)       He has a pounding heart beat when I check his pulse    I'm a nurse too. 7. RECURRENT SYMPTOM: "Have you ever had this before?" If Yes, ask: "When was the last time?" and "What happened that time?"      No   Just started a month ago 8. CAUSE: "What do you think is causing the palpitations?"     Mother think it's the same condition as she has.   Mitral valve prolapse and regurgitation.  Also tricuspid valve regurgitation. 9. CARDIAC HISTORY: "Do you have any history of heart disease?" (e.g., heart attack, angina, bypass surgery, angioplasty, arrhythmia)      No  10. OTHER SYMPTOMS: "Do you have any other  symptoms?" (e.g., dizziness, chest pain, sweating, difficulty breathing)       Chest pain, shortness of breath,  pounding heart rate. 11. PREGNANCY: "Is there any chance you are pregnant?" "When was your last menstrual period?"       N/A  Protocols used: Heart Rate and Heartbeat Questions-A-AH

## 2024-01-24 NOTE — Telephone Encounter (Signed)
 Copied from CRM [id number]. Topic: Appointments - Appointment Scheduling >> Jan 24, 2024  9:57 AM Jakyia R wrote: Mom (DPR) called to schedule an appointment for the pt, she states she wants him to be seen due to rapid hate rate and chest pain during those episodes. He's not currently in paid but it has been some new happening often within the last month.,

## 2024-01-24 NOTE — Telephone Encounter (Signed)
 See triage notes from separate encounter.

## 2024-01-24 NOTE — Telephone Encounter (Signed)
 Appt scheduled for 01/31/14 at 10:00 a.m.

## 2024-02-01 ENCOUNTER — Ambulatory Visit: Admitting: Physician Assistant

## 2024-02-01 ENCOUNTER — Encounter: Payer: Self-pay | Admitting: Physician Assistant

## 2024-02-01 VITALS — BP 126/83 | HR 74 | Resp 16 | Ht 72.0 in | Wt 122.0 lb

## 2024-02-01 DIAGNOSIS — Z79899 Other long term (current) drug therapy: Secondary | ICD-10-CM

## 2024-02-01 DIAGNOSIS — H6123 Impacted cerumen, bilateral: Secondary | ICD-10-CM

## 2024-02-01 DIAGNOSIS — J302 Other seasonal allergic rhinitis: Secondary | ICD-10-CM

## 2024-02-01 DIAGNOSIS — G804 Ataxic cerebral palsy: Secondary | ICD-10-CM | POA: Diagnosis not present

## 2024-02-01 DIAGNOSIS — R002 Palpitations: Secondary | ICD-10-CM | POA: Diagnosis not present

## 2024-02-01 DIAGNOSIS — Z604 Social exclusion and rejection: Secondary | ICD-10-CM

## 2024-02-01 DIAGNOSIS — G40909 Epilepsy, unspecified, not intractable, without status epilepticus: Secondary | ICD-10-CM

## 2024-02-01 DIAGNOSIS — H9313 Tinnitus, bilateral: Secondary | ICD-10-CM

## 2024-02-01 DIAGNOSIS — Q233 Congenital mitral insufficiency: Secondary | ICD-10-CM

## 2024-02-01 NOTE — Progress Notes (Signed)
 Established patient visit  Patient: Tony Harper   DOB: [birth year]   28 y.o. Male  MRN: [id number] Visit Date: 02/01/2024  Today's healthcare provider: Blane Bunting, PA-C   No chief complaint on file.  Subjective       Discussed the use of AI scribe software for clinical note transcription with the patient, who gave verbal consent to proceed.  History of Present Illness PHUOC HUY is a 28 year old male with a history of seizures who presents with chest pain and palpitations.  He experiences chest pain that is sometimes stabbing, occurring at night, with a pain intensity described as 'hundred percent one'. Palpitations are rapid heartbeats felt daily. Symptoms initially seemed related to heartburn, often occurring after eating spicy foods, but have persisted and sometimes worsen with excitement or laughter. Associated symptoms include shortness of breath, dizziness, lightheadedness, and episodes of near syncope.  He has been on carbamazepine  and Dilantin  since 2019 for seizures, with no recent changes in dosage. Seizures can be triggered by excessive sleep or breathing. He experiences tinnitus, described as a loud 'eee' sound, occurring sporadically. Occasional dysphagia with food getting stuck in his throat is noted.       04/06/2023    1:14 PM 12/28/2022    2:33 PM 12/18/2021    8:33 AM  Depression screen PHQ 2/9  Decreased Interest 3 0 0  Down, Depressed, Hopeless 3 0 0  PHQ - 2 Score 6 0 0  Altered sleeping 3    Tired, decreased energy 1    Change in appetite 3    Feeling bad or failure about yourself  3    Trouble concentrating 0    Moving slowly or fidgety/restless 0    Suicidal thoughts 1    PHQ-9 Score 17         No data to display          Medications: Outpatient Medications Prior to Visit  Medication Sig   benzonatate  (TESSALON ) 200 MG capsule Take 1 capsule (200 mg total) by mouth 2 (two) times daily as needed for cough.   carbamazepine  (CARBATROL )  200 MG 12 hr capsule TAKE 1 CAPSULE BY MOUTH TWICE A DAY   chlorpheniramine-HYDROcodone (TUSSIONEX) 10-8 MG/5ML Take 5 mLs by mouth at bedtime as needed for cough.   phenytoin  (DILANTIN ) 100 MG ER capsule TAKE 2 CAPSULES BY MOUTH EVERY MORNING AND 1 CAPSULE EVERY NIGHT AT BEDTIME   No facility-administered medications prior to visit.    Review of Systems All negative Except see HPI       Objective    BP 126/83 (BP Location: Right Arm, Patient Position: Sitting, Cuff Size: Normal)   Pulse 74   Resp 16   Ht 6' (1.829 m)   Wt 122 lb (55.3 kg)   SpO2 100%   BMI 16.55 kg/m     Physical Exam Vitals reviewed.  Constitutional:      General: He is not in acute distress.    Appearance: Normal appearance. He is not diaphoretic.  HENT:     Head: Normocephalic and atraumatic.  Eyes:     General: No scleral icterus.    Conjunctiva/sclera: Conjunctivae normal.  Cardiovascular:     Rate and Rhythm: Normal rate and regular rhythm.     Pulses: Normal pulses.     Heart sounds: Normal heart sounds. No murmur heard. Pulmonary:     Effort: Pulmonary effort is normal. No respiratory distress.     Breath sounds: Normal breath  sounds. No wheezing or rhonchi.  Musculoskeletal:     Cervical back: Neck supple.     Right lower leg: No edema.     Left lower leg: No edema.  Lymphadenopathy:     Cervical: No cervical adenopathy.  Skin:    General: Skin is warm and dry.     Findings: No rash.  Neurological:     Mental Status: He is alert and oriented to person, place, and time. Mental status is at baseline.     Motor: Weakness (right side of body) present.     Coordination: Coordination abnormal.     Gait: Gait abnormal.     Deep Tendon Reflexes: Reflexes abnormal.  Psychiatric:        Mood and Affect: Mood normal.        Behavior: Behavior normal.      No results found for any visits on 02/01/24.      Assessment & Plan Tachycardia and Chest Pain Reports rapid heartbeats and  chest pain with associated symptoms. Symptoms initially thought to be heartburn but have progressed. Medications may affect cardiac function. Differential includes arrhythmias. No recent cardiology or neurology consultation. Advised emergency evaluation if symptoms worsen. - Urgent cardiology/electrophysiology referral for further investigation and monitoring. - Order blood work for medication levels and relevant parameters. -Urgent referral to neurology, a referral to neurology is pending since 2024/7 EKG showed sinus rhythm with sinus arrhythmia with short PR Will follow-up  Seizure Disorder On carbamazepine  and Dilantin  since 2019. Symptoms may relate to medication or neurological issues. No recent neurology consultation. Medication dosage assessment needed to avoid seizure risk. - Urgent neurology referral for medication dosage and seizure management. Advised his father who is accompanied him today contact [facility] for assessment  Anxiety and Depression Chronic Has anxiety and depression, not on medication due to interaction concerns. No recent psychiatric consultation. - Referral to psychiatry for evaluation and management.  Tinnitus Reports bilateral intermittent tinnitus. Etiology unclear, may relate to blood pressure or other factors. - Referral to ENT for evaluation and possible earwax removal.  Allergic Rhinitis Nasal congestion and swelling likely due to allergies, possibly contributing to tinnitus. - Prescribe Flonase for nasal congestion. - Recommend nasal saline rinse.   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) Palpitations  - CBC with Differential/Platelet - Comprehensive metabolic panel with GFR - TSH - Ambulatory referral to Cardiology - EKG 12-Lead  Long-term use of high-risk medication  - Carbamazepine  Level (Tegretol ), total - Dilantin  (Phenytoin ) level, total  Excessive cerumen in both ear canals Sometime pulsating - Ambulatory referral to ENT  Tinnitus of both ears  - Ambulatory referral to ENT    Orders Placed This Encounter  Procedures   CBC with Differential/Platelet   Comprehensive metabolic panel with GFR   TSH   Carbamazepine  Level (Tegretol ), total   Dilantin  (Phenytoin ) level, total   Ambulatory referral to Cardiology    Referral Priority:   Urgent    Referral Type:   Consultation    Referral Reason:   Specialty Services Required    Number of Visits Requested:   1   Ambulatory referral to Neurology    Referral Priority:  Urgent    Referral Type:   Consultation    Referral Reason:   Specialty Services Required    Requested Specialty:   Neurology    Number of Visits Requested:   1   Ambulatory referral to ENT    Referral Priority:   Routine    Referral Type:   Consultation    Referral Reason:   Specialty Services Required    Requested Specialty:   Otolaryngology    Number of Visits Requested:   1   EKG 12-Lead    No follow-ups on file.   The patient was advised to call back or seek an in-person evaluation if the symptoms worsen or if the condition fails to improve as anticipated.  I discussed the assessment and treatment plan with the patient. The patient was provided an opportunity to ask questions and all were answered. The patient agreed with the plan and demonstrated an understanding of the instructions.  I, Renaye Janicki, PA-C have reviewed all documentation for this visit. The documentation on 02/01/2024  for the exam, diagnosis, procedures, and orders are all accurate and complete.  Blane Bunting,  [facility], MMS [facility] [phone] (phone) [phone] (fax)  [facility]

## 2024-02-02 LAB — COMPREHENSIVE METABOLIC PANEL WITH GFR
ALT: 24 IU/L (ref 0–44)
AST: 17 IU/L (ref 0–40)
Albumin: 4.7 g/dL (ref 4.3–5.2)
Alkaline Phosphatase: 139 IU/L — ABNORMAL HIGH (ref 44–121)
BUN/Creatinine Ratio: 12 (ref 9–20)
BUN: 14 mg/dL (ref 6–20)
Bilirubin Total: <0.2 mg/dL (ref 0.0–1.2)
CO2: 23 mmol/L (ref 20–29)
Calcium: 9.8 mg/dL (ref 8.7–10.2)
Chloride: 104 mmol/L (ref 96–106)
Creatinine, Ser: 1.16 mg/dL (ref 0.76–1.27)
Globulin, Total: 2.4 g/dL (ref 1.5–4.5)
Glucose: 70 mg/dL (ref 70–99)
Potassium: 4.3 mmol/L (ref 3.5–5.2)
Sodium: 143 mmol/L (ref 134–144)
Total Protein: 7.1 g/dL (ref 6.0–8.5)
eGFR: 90 mL/min/{1.73_m2} (ref >59)

## 2024-02-02 LAB — CBC WITH DIFFERENTIAL/PLATELET
Basophils Absolute: 0.1 10*3/uL (ref 0.0–0.2)
Basos: 1 %
EOS (ABSOLUTE): 0.1 10*3/uL (ref 0.0–0.4)
Eos: 1 %
Hematocrit: 49.4 % (ref 37.5–51.0)
Hemoglobin: 16.6 g/dL (ref 13.0–17.7)
Immature Grans (Abs): 0 10*3/uL (ref 0.0–0.1)
Immature Granulocytes: 0 %
Lymphocytes Absolute: 2.4 10*3/uL (ref 0.7–3.1)
Lymphs: 37 %
MCH: 30.7 pg (ref 26.6–33.0)
MCHC: 33.6 g/dL (ref 31.5–35.7)
MCV: 92 fL (ref 79–97)
Monocytes Absolute: 0.5 10*3/uL (ref 0.1–0.9)
Monocytes: 8 %
Neutrophils Absolute: 3.3 10*3/uL (ref 1.4–7.0)
Neutrophils: 53 %
Platelets: 223 10*3/uL (ref 150–450)
RBC: 5.4 x10E6/uL (ref 4.14–5.80)
RDW: 12.4 % (ref 11.6–15.4)
WBC: 6.4 10*3/uL (ref 3.4–10.8)

## 2024-02-02 LAB — CARBAMAZEPINE LEVEL, TOTAL: Carbamazepine (Tegretol), S: 4.4 ug/mL (ref 4.0–12.0)

## 2024-02-02 LAB — PHENYTOIN LEVEL, TOTAL: Phenytoin (Dilantin), Serum: 11.7 ug/mL (ref 10.0–20.0)

## 2024-02-02 LAB — TSH: TSH: 1.42 u[IU]/mL (ref 0.450–4.500)

## 2024-02-03 ENCOUNTER — Emergency Department

## 2024-02-03 ENCOUNTER — Emergency Department
Admission: EM | Admit: 2024-02-03 | Discharge: 2024-02-03 | Disposition: A | Discharge status: Home / Self Care | Attending: Emergency Medicine | Admitting: Emergency Medicine

## 2024-02-03 ENCOUNTER — Other Ambulatory Visit: Payer: Self-pay

## 2024-02-03 ENCOUNTER — Ambulatory Visit: Payer: Self-pay | Admitting: Physician Assistant

## 2024-02-03 DIAGNOSIS — Z79899 Other long term (current) drug therapy: Secondary | ICD-10-CM | POA: Insufficient documentation

## 2024-02-03 DIAGNOSIS — R079 Chest pain, unspecified: Secondary | ICD-10-CM | POA: Insufficient documentation

## 2024-02-03 DIAGNOSIS — Z8673 Personal history of transient ischemic attack (TIA), and cerebral infarction without residual deficits: Secondary | ICD-10-CM | POA: Diagnosis not present

## 2024-02-03 LAB — CARBAMAZEPINE LEVEL, TOTAL: Carbamazepine Lvl: 4 ug/mL (ref 4.0–12.0)

## 2024-02-03 LAB — CBC WITH DIFFERENTIAL/PLATELET
Abs Immature Granulocytes: 0.02 10*3/uL (ref 0.00–0.07)
Basophils Absolute: 0.1 10*3/uL (ref 0.0–0.1)
Basophils Relative: 1 %
Eosinophils Absolute: 0.1 10*3/uL (ref 0.0–0.5)
Eosinophils Relative: 2 %
HCT: 43.8 % (ref 39.0–52.0)
Hemoglobin: 15.5 g/dL (ref 13.0–17.0)
Immature Granulocytes: 0 %
Lymphocytes Relative: 46 %
Lymphs Abs: 3 10*3/uL (ref 0.7–4.0)
MCH: 30.6 pg (ref 26.0–34.0)
MCHC: 35.4 g/dL (ref 30.0–36.0)
MCV: 86.6 fL (ref 80.0–100.0)
Monocytes Absolute: 0.5 10*3/uL (ref 0.1–1.0)
Monocytes Relative: 8 %
Neutro Abs: 2.8 10*3/uL (ref 1.7–7.7)
Neutrophils Relative %: 43 %
Platelets: 199 10*3/uL (ref 150–400)
RBC: 5.06 MIL/uL (ref 4.22–5.81)
RDW: 12.1 % (ref 11.5–15.5)
WBC: 6.6 10*3/uL (ref 4.0–10.5)
nRBC: 0 % (ref 0.0–0.2)

## 2024-02-03 LAB — TROPONIN I (HIGH SENSITIVITY)
Troponin I (High Sensitivity): 3 ng/L (ref <18)
Troponin I (High Sensitivity): 3 ng/L (ref <18)

## 2024-02-03 LAB — COMPREHENSIVE METABOLIC PANEL WITH GFR
ALT: 24 U/L (ref 0–44)
AST: 22 U/L (ref 15–41)
Albumin: 3.9 g/dL (ref 3.5–5.0)
Alkaline Phosphatase: 97 U/L (ref 38–126)
Anion gap: 6 (ref 5–15)
BUN: 17 mg/dL (ref 6–20)
CO2: 27 mmol/L (ref 22–32)
Calcium: 9.3 mg/dL (ref 8.9–10.3)
Chloride: 105 mmol/L (ref 98–111)
Creatinine, Ser: 0.92 mg/dL (ref 0.61–1.24)
GFR, Estimated: >60 mL/min (ref >60)
Glucose, Bld: 115 mg/dL — ABNORMAL HIGH (ref 70–99)
Potassium: 3.9 mmol/L (ref 3.5–5.1)
Sodium: 138 mmol/L (ref 135–145)
Total Bilirubin: 0.6 mg/dL (ref 0.0–1.2)
Total Protein: 6.7 g/dL (ref 6.5–8.1)

## 2024-02-03 LAB — LIPASE, BLOOD: Lipase: 37 U/L (ref 11–51)

## 2024-02-03 LAB — PHENYTOIN LEVEL, TOTAL: Phenytoin Lvl: 10.7 ug/mL (ref 10.0–20.0)

## 2024-02-03 LAB — CK: Total CK: 83 U/L (ref 49–397)

## 2024-02-03 LAB — TSH: TSH: 3.662 u[IU]/mL (ref 0.350–4.500)

## 2024-02-03 LAB — D-DIMER, QUANTITATIVE: D-Dimer, Quant: <0.27 ug{FEU}/mL (ref 0.00–0.50)

## 2024-02-03 MED ORDER — FAMOTIDINE IN NACL 20-0.9 MG/50ML-% IV SOLN
20.0000 mg | Freq: Once | INTRAVENOUS | Status: AC
  Administered 2024-02-03: 20 mg via INTRAVENOUS
  Filled 2024-02-03: qty 50

## 2024-02-03 NOTE — ED Notes (Signed)
 CCMD called at this time

## 2024-02-03 NOTE — ED Triage Notes (Signed)
 Pt to ED from home with chest pain in central chest that radiates to right arm for the last month. Mother also states that starting last night pt is having muscle spasms in head/ legs/arms during sleep. Denies fevers/ N/V.

## 2024-02-03 NOTE — ED Provider Notes (Addendum)
 [facility] Provider Note    Event Date/Time   First MD Initiated Contact with Patient 02/03/24 [phone]     (approximate)   History   Chest Pain and Spasms   HPI  Tony Harper is a 28 y.o. male  brought to the ED from home by his parents with a chief complaint of chest pain. Patient with a medical history of cerebral palsy, MR, neuromuscular disorder, h/o stroke, seizure disorder who has been experiencing chest pain for the past month. Parents state he likes to eat spicy foods. Unclear pattern to pain; has not taken anything for it and it resolves on its own. Sometimes associated with shortness of breath and palpitations. Denies associated fever/chills, cough, abdominal pain nausea, vomiting or dizziness.     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 No pertinent surgical history.   Home Medications   Prior to Admission medications   Medication Sig Start Date End Date Taking? Authorizing Provider  benzonatate  (TESSALON ) 200 MG capsule Take 1 capsule (200 mg total) by mouth 2 (two) times daily as needed for cough. 08/02/23   Normie Becton, FNP   carbamazepine  (CARBATROL ) 200 MG 12 hr capsule TAKE 1 CAPSULE BY MOUTH TWICE A DAY 11/03/23   Ostwalt, Janna, PA-C  chlorpheniramine-HYDROcodone (TUSSIONEX) 10-8 MG/5ML Take 5 mLs by mouth at bedtime as needed for cough. 08/02/23   Normie Becton, FNP  phenytoin  (DILANTIN ) 100 MG ER capsule TAKE 2 CAPSULES BY MOUTH EVERY MORNING AND 1 CAPSULE EVERY NIGHT AT BEDTIME 01/18/24   Ostwalt, Janna, PA-C     Allergies  Tessalon  [benzonatate ]   Family History   Family History  Problem Relation Age of Onset   Hypertension Mother    Asthma Mother    Asthma Sister    Hypertension Maternal Aunt    Hypertension Maternal Grandmother    Stroke Maternal Grandmother    Cancer Maternal Grandfather    Stroke Paternal Grandmother      Physical Exam  Triage Vital Signs: ED Triage Vitals  Encounter Vitals Group     BP 02/03/24 0536 136/89     Girls Systolic BP Percentile --      Girls Diastolic BP Percentile --      Boys Systolic BP Percentile --      Boys Diastolic BP Percentile --      Pulse Rate 02/03/24 0536 90     Resp 02/03/24 0536 16     Temp 02/03/24 0536 97.8 F (36.6 C)     Temp Source 02/03/24 0536 Axillary     SpO2 02/03/24 0535 100 %  Weight 02/03/24 0537 122 lb (55.3 kg)     Height 02/03/24 0537 5' 6 (1.676 m)     Head Circumference --      Peak Flow --      Pain Score --      Pain Loc --      Pain Education --      Exclude from Growth Chart --     Updated Vital Signs: BP 136/89 (BP Location: Left Arm)   Pulse 90   Temp 97.8 F (36.6 C) (Axillary)   Resp 16   Ht 5' 6 (1.676 m)   Wt 55.3 kg   SpO2 100%   BMI 19.69 kg/m    General: Awake, no distress.  CV:  RRR. Good peripheral perfusion.  Resp:  Normal effort. CTAB. Abd:  Nontender. No distention.  Other:  Bilateral calves are supple without tenderness. Contractured extremities.   ED Results / Procedures / Treatments  Labs (all labs ordered are listed, but only abnormal results are displayed) Labs  Reviewed  CBC WITH DIFFERENTIAL/PLATELET  CARBAMAZEPINE  LEVEL, TOTAL  PHENYTOIN  LEVEL, TOTAL  D-DIMER, QUANTITATIVE  COMPREHENSIVE METABOLIC PANEL WITH GFR  CK  TSH  LIPASE, BLOOD  TROPONIN I (HIGH SENSITIVITY)     EKG  ED ECG REPORT I, Audris Speaker J, the attending physician, personally viewed and interpreted this ECG.   Date: 02/03/2024  EKG Time: 0533  Rate: 94  Rhythm: normal sinus rhythm  Axis: Normal  Intervals:none  ST&T Change: Nonspecific    RADIOLOGY I have independently visualized and interpreted patient's imaging study as well as noted the radiology interpretation:  CXR: No acute cardiopulmonary process  Official radiology report(s): DG Chest Port 1 View Result Date: 02/03/2024 CLINICAL DATA:  [id number] with central chest pain radiating to the right arm. EXAM: PORTABLE CHEST 1 VIEW COMPARISON:  Chest CT report 07/22/2018. Images are unavailable in PACS. FINDINGS: There is an electronic device overlying the left mid chest. Heart size and vasculature are normal and the mediastinum is normally outlined. The lungs are hypoinflated but generally clear with limited view of the bases. There is no substantial pleural effusion. Thoracic cage is intact. Multiple overlying monitor wires. IMPRESSION: Hypoinflation without evidence of acute chest disease. Limited view of the bases. Electronically Signed   By: Denman Fischer M.D.   On: 02/03/2024 06:06     PROCEDURES:  Critical Care performed: No  .1-3 Lead EKG Interpretation  Performed by: Norlene Beavers, MD Authorized by: Norlene Beavers, MD     Interpretation: normal     ECG rate:  90   ECG rate assessment: normal     Rhythm: sinus rhythm     Ectopy: none     Conduction: normal   Comments:     Patient placed on cardiac monitor to evaluate for arrthymias    MEDICATIONS ORDERED IN ED: Medications  famotidine (PEPCID) IVPB 20 mg premix (0 mg Intravenous Stopped 02/03/24 0645)     IMPRESSION / MDM / ASSESSMENT AND PLAN  / ED COURSE  I reviewed the triage vital signs and the nursing notes.                             28 year old male presenting with chest pain x 1 month. Differential diagnosis includes, but is not limited to, ACS, aortic dissection, pulmonary embolism, cardiac tamponade, pneumothorax, pneumonia, pericarditis, myocarditis, GI-related causes including esophagitis/gastritis, and musculoskeletal chest wall pain.   I  have noted patient's PCP appointment from 02/01/2024 for same. He had a cardiology appointment yesterday and Zio monitor placed x 3 days. I am unfortunately unable to see his cardiology office note.  Patient's presentation is most consistent with acute complicated illness / injury requiring diagnostic workup.  The patient is on the cardiac monitor to evaluate for evidence of arrhythmia and/or significant heart rate changes.  Will obtain cardiac panel, add LFTs/lipase, TSH, Ddimer. Check AED levels, chest xray. Administer IV Pepcid and reassess.  Clinical Course as of 02/03/24 0654  Thu Feb 03, 2024  0647 Patient feeling better. Care will be transferred to the oncoming provider at change of shift pending labwork and repeat troponin. Anticipate discharge home if unremarkable. [JS]    Clinical Course User Index [JS] Norlene Beavers, MD     FINAL CLINICAL IMPRESSION(S) / ED DIAGNOSES   Final diagnoses:  Chest pain, unspecified type     Rx / DC Orders   ED Discharge Orders     None        Note:  This document was prepared using Dragon voice recognition software and may include unintentional dictation errors.   Estalee Mccandlish J, MD 02/03/24 1610    Braley Luckenbaugh J, MD 02/03/24 [phone]

## 2024-02-03 NOTE — Discharge Instructions (Signed)
 Your laboratory workup was reassuring today with no acute findings.  No evidence of any heart damage.  Medication has been sent to your pharmacy to help with your chest pain.  Please follow-up with your primary care provider within the next week for reassessment.

## 2024-02-03 NOTE — ED Provider Notes (Signed)
  Physical Exam  BP 136/89 (BP Location: Left Arm)   Pulse 90   Temp 97.8 F (36.6 C) (Axillary)   Resp 16   Ht 5' 6 (1.676 m)   Wt 55.3 kg   SpO2 100%   BMI 19.69 kg/m   Physical Exam  Procedures  Procedures  ED Course / MDM   Clinical Course as of 02/03/24 0748  Thu Feb 03, 2024  0647 Patient feeling better. Care will be transferred to the oncoming provider at change of shift pending labwork and repeat troponin. Anticipate discharge home if unremarkable. [JS]    Clinical Course User Index [JS] Norlene Beavers, MD   Medical Decision Making Amount and/or Complexity of Data Reviewed Labs: ordered. Radiology: ordered.  Risk Prescription drug management.   Received patient in signout.  28 year old male with history of cerebral palsy, MR, neuromuscular disorder, prior CVA, seizure disorder presenting today for chest pain for 1 month.  Symptoms reportedly may happen after eat spicy foods but has not taken anything for symptoms.  Generally resolves on its own. Reportedly did see cardiology yesterday and was placed on a ZIO monitor.  Initial provider gave him Pepcid with improvement in symptoms.  EKG reassuring.  Laboratory workup including CBC, D-dimer, carbamazepine  and phenytoin  levels, CMP, CK, lipase all reassuring.  Initial troponin negative.  Patient was signed out pending second troponin and if negative was safe for discharge as he was feeling better.  Repeat troponin negative.  Laboratory workup elsewise all reassuring and patient safe for discharge.  Started on Pepcid per previous ED doc.  Given strict return precautions.     Kandee Orion, MD 02/03/24 [phone]

## 2024-02-08 LAB — GAMMA GT: GGT: 53 IU/L (ref 0–65)

## 2024-02-08 LAB — SPECIMEN STATUS REPORT

## 2024-03-02 ENCOUNTER — Ambulatory Visit: Admitting: Physician Assistant

## 2024-03-02 VITALS — BP 108/75 | HR 65 | Resp 16 | Ht 66.0 in | Wt 124.0 lb

## 2024-03-02 DIAGNOSIS — F419 Anxiety disorder, unspecified: Secondary | ICD-10-CM

## 2024-03-02 DIAGNOSIS — H9313 Tinnitus, bilateral: Secondary | ICD-10-CM

## 2024-03-02 DIAGNOSIS — E049 Nontoxic goiter, unspecified: Secondary | ICD-10-CM

## 2024-03-02 DIAGNOSIS — K219 Gastro-esophageal reflux disease without esophagitis: Secondary | ICD-10-CM

## 2024-03-02 DIAGNOSIS — G804 Ataxic cerebral palsy: Secondary | ICD-10-CM

## 2024-03-02 DIAGNOSIS — F32A Depression, unspecified: Secondary | ICD-10-CM

## 2024-03-02 DIAGNOSIS — G40909 Epilepsy, unspecified, not intractable, without status epilepticus: Secondary | ICD-10-CM

## 2024-03-02 NOTE — Progress Notes (Signed)
 Established patient visit  Patient: Tony Harper   DOB: 03/05/1996   28 y.o. Male  MRN: [id number] Visit Date: 03/02/2024  Today's healthcare provider: Jolynn Spencer, PA-C   Chief Complaint  Patient presents with   Follow-up    F/u    Subjective     HPI     Follow-up    Additional comments: F/u       Last edited by Marylen Odella LITTIE, CMA on 03/02/2024 11:22 AM.       Discussed the use of AI scribe software for clinical note transcription with the patient, who gave verbal consent to proceed.  History of Present Illness Tony Harper is a 28 year old male with tachycardia, chest pain, and seizure disorder who presents for follow-up on multiple medical issues. He is accompanied by his mother, who is his primary caregiver.  Abass has a history of tachycardia and chest pain, with recent cardiology evaluation showing normal findings. He takes Pepcid  AC, which effectively manages these symptoms. He has a seizure disorder managed with carbamazepine  and Dilantin , and he recently consulted a neurologist.  He experiences anxiety and depression, influenced by social factors. He has not yet accessed behavioral health services due to insurance and availability issues and is not on additional medication for these conditions.  He reports tinnitus and has an upcoming ENT appointment for ear cleaning. His cerebral palsy affects mobility, with a slightly contracted right arm and back pain, especially during sleep.  He experiences cold intolerance and has a family history of thyroid  issues. He requires assistance with urination and defecation due to incontinence. His sleep is disrupted, with only three to four hours at a time, and he wakes to play video games. He does not snore but sometimes feels congested.  His diet includes protein shakes and breakfast essentials to supplement low vegetable intake, and he enjoys chocolate milk in the morning.     04/06/2023    1:14 PM 12/28/2022    2:33 PM  12/18/2021    8:33 AM  PHQ9 SCORE ONLY  PHQ-9 Total Score 17 0 0         04/06/2023    1:14 PM 12/28/2022    2:33 PM 12/18/2021    8:33 AM  Depression screen PHQ 2/9  Decreased Interest 3 0 0  Down, Depressed, Hopeless 3 0 0  PHQ - 2 Score 6 0 0  Altered sleeping 3    Tired, decreased energy 1    Change in appetite 3    Feeling bad or failure about yourself  3    Trouble concentrating 0    Moving slowly or fidgety/restless 0    Suicidal thoughts 1    PHQ-9 Score 17         No data to display          Medications: Outpatient Medications Prior to Visit  Medication Sig   benzonatate  (TESSALON ) 200 MG capsule Take 1 capsule (200 mg total) by mouth 2 (two) times daily as needed for cough.   carbamazepine  (CARBATROL ) 200 MG 12 hr capsule TAKE 1 CAPSULE BY MOUTH TWICE A DAY   chlorpheniramine-HYDROcodone (TUSSIONEX) 10-8 MG/5ML Take 5 mLs by mouth at bedtime as needed for cough.   phenytoin  (DILANTIN ) 100 MG ER capsule TAKE 2 CAPSULES BY MOUTH EVERY MORNING AND 1 CAPSULE EVERY NIGHT AT BEDTIME   No facility-administered medications prior to visit.    Review of Systems All negative Except see HPI  Objective    BP 108/75 (BP Location: Right Arm, Patient Position: Sitting)   Pulse 65   Resp 16   Ht 5' 6 (1.676 m)   Wt 124 lb (56.2 kg)   SpO2 99%   BMI 20.01 kg/m     Physical Exam Vitals reviewed.  Constitutional:      General: He is not in acute distress.    Appearance: Normal appearance. He is not diaphoretic.  HENT:     Head: Normocephalic and atraumatic.  Eyes:     General: No scleral icterus.    Conjunctiva/sclera: Conjunctivae normal.  Cardiovascular:     Rate and Rhythm: Normal rate and regular rhythm.     Pulses: Normal pulses.     Heart sounds: Normal heart sounds. No murmur heard. Pulmonary:     Effort: Pulmonary effort is normal. No respiratory distress.     Breath sounds: Normal breath sounds. No wheezing or rhonchi.  Musculoskeletal:      Cervical back: Neck supple.     Right lower leg: No edema.     Left lower leg: No edema.  Lymphadenopathy:     Cervical: No cervical adenopathy.  Skin:    General: Skin is warm and dry.     Findings: No rash.  Neurological:     Mental Status: He is alert and oriented to person, place, and time. Mental status is at baseline.  Psychiatric:        Mood and Affect: Mood normal.        Behavior: Behavior normal.      No results found for any visits on 03/02/24.      Assessment & Plan Tachycardia and non anginal Chest Pain/acid reflux Cardiological evaluation normal with normal echo , cardiac monitor showed sinus rhythm  Suspected acid reflux  Pepcid  AC effective. Dietary modifications crucial. - Continue Pepcid  AC. - Advise dietary modifications to avoid spicy foods, caffeine, tomatoes, oranges, and chocolate.  Seizure Disorder Chronic and controlled  seizure disorder managed with carbamazepine  and Dilantin . Medication levels therapeutic. Annual neurology follow-ups recommended. - Continue carbamazepine  and Dilantin . - Schedule annual follow-up with neurology. Will follow-up  Anxiety and Depression Chronic worsening anxiety and depression related to social factors. Insurance and availability issues delayed psychiatric consultation. Recommended [facility] walk-in clinic for evaluation and potential treatment. - Visit [facility] walk-in clinic for behavioral health evaluation   Cerebral Palsy Cerebral palsy with worsening mobility and slightly contracted right arm. Regular physical therapy essential. - Schedule and complete physical therapy sessions. - Ensure home environment is fall-proof.  Tinnitus Tinnitus present. ENT appointment scheduled for ear cleaning. - Proceed with scheduled ENT appointment for ear cleaning.  Thyroid  Evaluation Family history of thyroid  cancer and slight thyroid  enlargement. Recommended ultrasound to establish baseline. - Order thyroid   ultrasound.  Reviewed labs from 02/01/2024 We will repeat lab at the follow-up  Dietary Habits Discussed importance of incorporating vegetables and nutrients. Suggested smoothies to increase intake. - Encourage use of smoothies with vegetables and fruits to improve dietary intake.  Follow-up Plans to review progress on physical therapy and other treatments in three months. - Schedule follow-up appointment in three months. Will check his vaccination status and schedule for annual wellness and follow-up   No orders of the defined types were placed in this encounter.   No follow-ups on file.   The patient was advised to call back or seek an in-person evaluation if the symptoms worsen or if the condition fails to improve as anticipated.  I discussed  the assessment and treatment plan with the patient. The patient was provided an opportunity to ask questions and all were answered. The patient agreed with the plan and demonstrated an understanding of the instructions.  I, Jameon Deller, PA-C have reviewed all documentation for this visit. The documentation on 03/02/2024  for the exam, diagnosis, procedures, and orders are all accurate and complete.  Jolynn Spencer, [facility], MMS [facility] [phone] (phone) [phone] (fax)  [facility]

## 2024-03-13 NOTE — Therapy (Signed)
 OUTPATIENT PHYSICAL THERAPY NEURO EVALUATION   Patient Name: Tony Harper MRN: [id number] DOB:17-Jan-1996, 28 y.o., male Today's Date: 03/14/2024   PCP: Harper, Janna, PA-C REFERRING PROVIDER: Lane Arthea BRAVO, MD   END OF SESSION:   PT End of Session - 03/14/24 1316     Visit Number 1    Number of Visits 24    Date for PT Re-Evaluation 06/06/24    PT Start Time 1317    PT Stop Time 1403    PT Time Calculation (min) 46 min    Equipment Utilized During Treatment Gait belt    Activity Tolerance Patient tolerated treatment well    Behavior During Therapy WFL for tasks assessed/performed          Past Medical History:  Diagnosis Date   Cerebral palsy (HCC)    Cysts of right lower eyelid 02/16/2017   Added automatically from request for surgery [id number]     Added automatically from request for surgery [id number]   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)    Neuromuscular disorder (HCC)    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 Patient Active Problem List   Diagnosis Date Noted   Gastroesophageal reflux disease without esophagitis 03/02/2024   Tinnitus of both ears 03/02/2024   Enlarged thyroid  gland 03/02/2024   Acute non-recurrent pansinusitis 08/02/2023   Seasonal allergic rhinitis 08/02/2023   Anxiety and depression 04/08/2023   Encounter for therapeutic drug monitoring 10/17/2018   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) 06/23/2018   Cerebral palsy (HCC) 06/23/2018   Seizure disorder (HCC) 01/09/2018    ONSET DATE: ~Gradually over the past 2 years  REFERRING DIAG: R29.898 (ICD-10-CM) - Leg weakness   THERAPY DIAG:  Unsteadiness on feet  Difficulty in walking  Abnormality of gait  Generalized weakness  Rationale for Evaluation and Treatment: Rehabilitation  SUBJECTIVE:                                                                                                                                                                                              SUBJECTIVE STATEMENT:  Patient's mother reports patient with 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 Pt's mother noticed pt's strength and balance has declined gradually over the past 2 years. Patient's mother reports pt has R inattention/neglect. Pt's mother reports pt has R hand contracture and requests pt receive OT to address this.  Pt's mother reports previously pt could walk independently, although slow with a wide gait and excessive forward  trunk flexed posture, but now he is unable to walk without constant L HHA. Pt's mother reports pt previously had a posterior walker, but he fell forward out of it. Mother reports pt also tried a RW and the same thing happened with pt having anterior LOB pushing AD too far out in front of himself (she states same thing happens if patient tries to walk with a buggy in the store).   Pt's mother reports when pt navigating steps she provides B HHA rather than him using HRs; otherwise, he will bump up/down the stairs on his bottom..  Patient stating he wants to be able to walk without needing HHA from his mother.   Per Neurology MD note from 02/03/2024: Patient is here to establish care and for management of anti epileptics. Patient has PMH of cerebral palsy and has been on Carbamazepine  and Dilantin  since 2019 for seizures. Seizures can be triggered by excessive sleep or breathing. Patient was released from [facility] when seizure frequency decreased. Presents today with well-managed seizures. Last known seizure was in November 2023. Seizures present as petite grand mal, full grand mal, or absence seizures. His mother reports he will aspirate and get pneumonia with seizures. Recent episode of full-body muscle twitching on 02/03/2024, occurring intermittently throughout the night last night. Wearing a heart monitor to evaluate 10/10  chest pain and SOB worsening for the past month. Difficulty staying asleep. He has been depressed.  ..SABRASABRAPatient with concerns of falls, imbalance, and weakness...SABRASABRAReferral to PT for leg weakness and gait training   Per Cardiology note on 02/16/2024: Recent evaluation with normal echo, cardiac monitor showed sinus rhythm without any significant arrhythmias. Stable from cardiac standpoint. Blood pressure well-controlled and euvolemic on exam.   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 Yes: Pain description: patient reports potential discomfort, but unable to clarify. Pt presents with no indication of pain during session.   PRECAUTIONS: Fall and Other: hx of seizures (chronic and stable)  RED FLAGS: None   WEIGHT BEARING RESTRICTIONS: No  FALLS: Has patient fallen in last 6 months? Yes. Number of falls mother reports pt has frequent falls when he gets up in the middle of the night to go to bathroom by himself ~1x every 2 weeks  LIVING ENVIRONMENT: Lives with: lives with their family and mother and father Lives in: House/apartment Stairs: Yes: Internal: 5 steps; bilateral but cannot reach both and External: 2 steps; using B HHA from pt's mother Has following equipment at home: shower chair CLOF: Patient scoots on his buttocks to navigate indoor stairs because the handrails are not close enough to reach. Patient using B HHA from his mother to navigate external stairs. Pt requires constant L HHA from his mother to ambulate (otherwise according to pt's mother, he is flexed forward so much that he is almost in quadruped). Mother reports pt can perform stand pivot transfers from EOB to gaming chair mod-I to wheel around in his room in the chair.   PLOF: Independent with household mobility without device, Independent  with gait, Independent with transfers, and Needs assistance with ADLs  PATIENT GOALS: walk independently without always needing HHA from his mother, get his R leg as strong his L leg  OBJECTIVE:  Note: Objective measures were completed at Evaluation unless otherwise noted.  DIAGNOSTIC FINDINGS: N/A  COGNITION: Overall cognitive status: History of  cognitive impairments - at baseline    SENSATION: Light touch: WFL Difficulty following commands for testing, but pt's mother reports no known sensory changes  COORDINATION: Impaired with wide based gait and R LE incoordination greater than L LE  EDEMA:  Not formally assessed, but none observed  MUSCLE TONE: RLE: Within functional limits in sitting; however, unsure of potential tone contributions to functional movements  MUSCLE LENGTH: Not formally assessed   DTRs:  Not formally assessed   POSTURE: rounded shoulders, forward head, anterior pelvic tilt, and excessive anterior pelvis tilt with forward trunk flexion  LOWER EXTREMITY ROM:     Active  Right Eval Left Eval  Hip flexion Hips move in excessive hip external rotation during hip flexion Hips move in excessive hip external rotation during hip flexion  Hip extension    Hip abduction    Hip adduction    Hip internal rotation    Hip external rotation    Knee flexion    Knee extension    Ankle dorsiflexion    Ankle plantarflexion    Ankle inversion    Ankle eversion     (Blank rows = not tested)  LOWER EXTREMITY MMT:    MMT Right Eval Left Eval  Hip flexion 4 5  Hip extension    Hip abduction    Hip adduction    Hip internal rotation    Hip external rotation    Knee flexion 4 5  Knee extension 4+ 5  Ankle dorsiflexion 4 5  Ankle plantarflexion 4 5  Ankle inversion    Ankle eversion    *pt noted to have some shaking movements during testing  (Blank rows = not tested)  Manual Muscle Test Scale 0/5 = No muscle contraction can be seen or felt 1/5 =  Contraction can be felt, but there is no motion 2-/5 = Part moves through incomplete ROM w/ gravity decreased 2/5 = Part moves through complete ROM w/ gravity decreased 2+/5 = Part moves through incomplete ROM (<50%) against gravity or through complete ROM w/ gravity 3-/5 = Part moves through incomplete ROM (>50%) against gravity 3/5 = Part moves through complete ROM against gravity 3+/5 = Part moves through complete ROM against gravity/slight resistance 4-/5= Holds test position against slight to moderate pressure 4/5 = Part moves through complete ROM against gravity/moderate resistance 4+/5= Holds test position against moderate to strong pressure 5/5 = Part moves through complete ROM against gravity/full resistance  BED MOBILITY:  Findings: Sit to supine need to assess Supine to sit need to assess Mother reports pt with some challenges managing covers independently  TRANSFERS: Sit to stand: CGA and Min A  Assistive device utilized: L HHA     Stand to sit: CGA and Min A  Assistive device utilized: L HHA     Chair to chair: CGA and Min A  Assistive device utilized: L HHA       RAMP:  Not tested  CURB:  Not tested  STAIRS: Findings: Level of Assistance: Min A,  Stair Negotiation Technique: Step to Pattern vs Alternating Pattern  Forwards with Bilateral Rails Number of Stairs: 8, Height of Stairs: 6     Comments: On ascent, gradually has increased anterior trunk lean and then regresses from reciprocal to step-to pattern leading with R LE; step-to pattern on descent leading with L LE Pt may have excessive trunk flexion due to difficulty performing hip flexion to lift LEs onto step vs potential synergy pattern vs tonal pattern? Pt able  to descent with lighter min A   GAIT: Findings: Gait Characteristics:Decreased arm swing- Right, decreased step length- Right, decreased step length- Left, decreased stride length, ataxic, trunk flexed, wide BOS, and abducted- Right,  Distance  walked: ~128ft  Assistive device utilized:L HHA Level of assistance: Min A,  Comments: attempted to hover L HHA to challenge dynamic gait and progression to decreased reliance on L UE support; however, pt very fearful of this Very wide based gait and when pt doesn't feel secure he widens base even more by stepping R LE out laterally, short step lengths bilaterally, maintains trunk upright when using L HHA but with fatigue or fear of falling starts to flex trunk forward  FUNCTIONAL TESTS:  5 times sit to stand: need to assess Timed up and go (TUG): need to assess 6 minute walk test: need to assess 10 meter walk test: 0.80m/s using L HHA for min A (19.18 seconds)  Berg Balance Test: need to assess  PATIENT SURVEYS:   Stroke Impact Scale 16 (Copyrighted instrument, [facility])  In the past 2 weeks, how difficult was it to...  Rating Scale 5 = Not difficult at all 4 = A little difficult 3 = Somewhat difficult 2 = Very difficult 1 = Could not do at all  a. Dress the top part of your body? 1  b. Bathe yourself? 2  c. Get to the toilet on time? 3  d. Control your bladder (not have an accident)? 5  e. Control your bowels (not have an accident)? 5  f. Stand without losing balance? 2  g. Go shopping? 2  h. Do heavy household chores (e.g. vacuum, laundry or  yard work)? 1  i. Stay sitting without losing your balance? 4  j. Walk without losing your balance? 2  k. Move from a bed to a chair? 3  l. Walk fast? 2  m. Climb one flight of stairs? 1  n. Walk one block? 2  o. Get in and out of a car? 3  p. Carry heavy objects (e.g. bag of groceries) with your  affected hand? 1  Sum:  39/80 *completed by patient's mother*  MDC (Minimal Detectable Change) is >/=8                                                                                                                               TREATMENT DATE: 03/14/2024  Gait training ~150ft using L HHA with min assist  for balance with goal of decreasing L UE support by having pt hover his hand; however, pt very fearful of this and only slightly lets go of therapist's hand the final ~46ft - pt continues to demo very wide BOS.  Provided visual demonstration and education to pt's mother on safe set-up for pt to perform standing alternating foot taps Educated pt on maintaining upright posture - suggested performing in front of mirror for biofeedback  Unsure if forward trunk flexed posture  may be due to pt not feeling confident with balance and this is a guarded posture because noticed during session when he becomes fearful of LOB he widens his BOS and then flexes trunk forward.    PATIENT EDUCATION: Education details: PT POC, findings on assessment, HEP Person educated: Patient and Mother Education method: Explanation and Handouts Education comprehension: verbalized understanding and needs further education  HOME EXERCISE PROGRAM: Access Code: [email or web address] URL: [email or web address] Date: 03/14/2024 Prepared by: Connell Kiss  Exercises - Step Taps with Counter Support  - 1 x daily - 7 x weekly - 2 sets - 10 reps  GOALS: Goals reviewed with patient? Yes  SHORT TERM GOALS: Target date: 04/25/2024  Patient will be independent with home exercise program to improve strength/mobility for increased functional independence with ADLs and mobility.  Baseline: initiated on 7/22 and pt will require assistance from his mother Goal status: INITIAL   LONG TERM GOALS: Target date: 06/06/2024  Patient will increase Berg Balance score to > 51/56 to demonstrate improved balance and decreased fall risk during functional activities and ADLs.  Baseline: need to assess Goal status: INITIAL  2.  Patient will increase six minute walk test distance to >1063ft, using LRAD with no more than SBA, for progression to more independent community level ambulation, demonstrating improved gait endurance. Baseline: need  to assess Goal status: INITIAL  3.  Patient will increase 10 meter walk test to >1.23m/s using LRAD mod-I as to improve gait speed for better community ambulation and to reduce fall risk. Baseline: 0.66m/s using L HHA for min A  Goal status: INITIAL  4.  Patient will improve Stroke Impact Scale-16 by >9 points indicating patient experiencing increased independence with functional mobility tasks and ADLs to improve quality of life and decrease caregiver burden. (Questionnaire to be completed by his mother) Baseline: 39/80 Goal status: INITIAL  5.  Patient will be modified independent with household level ambulation using LRAD to improve QOL, decrease fall risk, and promote overall increased independence in the home.  Baseline: currently requires constant L HHA to ambulate Goal status: INITIAL  6.  Patient will be able to ascend/descend 4 steps using 1 HR support and no more than SBA to increase his independence with home entry.  Baseline: min A and B HR or B HHA support  Goal status: INITIAL  ASSESSMENT:  CLINICAL IMPRESSION: Patient is a 28 y.o. male who was seen today for physical therapy evaluation and treatment for worsening imbalance, incoordination, and R hemiparesis that have resulted in a significant decline in patient's independence. Patient's mother reports over the past 2 years patient has declined from being independent with household level functional mobility to now requiring constant L HHA to ambulate, which has resulted in frequent falls at night when pt getting up to go to the bathroom by himself. Patient requires constant L HHA to provide min A for transfers and ambulation with pt feeling unsafe standing without that support. Patient's mother reports pt has previously tried both a posterior walker and RW; however, pt is unsafe to use them due to anterior LOB and inability to safely manage the AD. Patient demonstrates significantly reduced gait speed on and per his mother's  report patient is having significant difficulties with ADLs and functional mobility as noted on SIS-16. Patient is highly motivated to improve his mobility and regain his independence. The pt will benefit from further skilled PT to improve these deficits in order to increase QOL, decrease fall risk, and ease/safety with ADLs  while decreasing caregiver burden.    OBJECTIVE IMPAIRMENTS: Abnormal gait, decreased activity tolerance, decreased balance, decreased cognition, decreased coordination, decreased endurance, decreased knowledge of use of DME, decreased mobility, difficulty walking, decreased strength, and impaired vision/preception.   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   REHAB POTENTIAL: Good  CLINICAL DECISION MAKING: Evolving/moderate complexity  EVALUATION COMPLEXITY: Moderate  PLAN:  PT FREQUENCY: 1-2x/week  PT DURATION: 12 weeks  PLANNED INTERVENTIONS: [id number]- PT Re-evaluation, 97750- Physical Performance Testing, 97110-Therapeutic exercises, 97530- Therapeutic activity, [id number]- Neuromuscular re-education, 97535- Self Care, [id number]- Manual therapy, [id number]- Gait training, [phone]- Orthotic Initial, [phone]- Orthotic/Prosthetic subsequent, [phone]- Canalith repositioning, [phone]- Electrical stimulation (manual), Patient/Family education, Balance training, Stair training, Taping, Joint mobilization, Spinal mobilization, Vestibular training, Visual/preceptual  remediation/compensation, Cognitive remediation, DME instructions, Cryotherapy, Moist heat, and Biofeedback  PLAN FOR NEXT SESSION:  - follow-up on OT referral - Berg Balance Test  - 5xSTS - TUG - 6 min walk test - follow-up on initial HEP     Vickki Igou, PT, DPT, NCS, CSRS Physical Therapist - [place]  [facility]  5:39 PM 03/14/24

## 2024-03-14 ENCOUNTER — Other Ambulatory Visit: Payer: Self-pay

## 2024-03-14 ENCOUNTER — Ambulatory Visit: Attending: Neurology | Admitting: Physical Therapy

## 2024-03-14 DIAGNOSIS — R2681 Unsteadiness on feet: Secondary | ICD-10-CM | POA: Insufficient documentation

## 2024-03-14 DIAGNOSIS — R531 Weakness: Secondary | ICD-10-CM | POA: Diagnosis present

## 2024-03-14 DIAGNOSIS — R269 Unspecified abnormalities of gait and mobility: Secondary | ICD-10-CM | POA: Diagnosis present

## 2024-03-14 DIAGNOSIS — R262 Difficulty in walking, not elsewhere classified: Secondary | ICD-10-CM | POA: Insufficient documentation

## 2024-03-16 ENCOUNTER — Ambulatory Visit
Admission: RE | Admit: 2024-03-16 | Discharge: 2024-03-16 | Disposition: A | Discharge status: Home / Self Care | Source: Ambulatory Visit | Attending: Physician Assistant | Admitting: Physician Assistant

## 2024-03-16 DIAGNOSIS — E049 Nontoxic goiter, unspecified: Secondary | ICD-10-CM | POA: Insufficient documentation

## 2024-03-20 ENCOUNTER — Ambulatory Visit: Payer: Self-pay | Admitting: Physician Assistant

## 2024-03-20 NOTE — Progress Notes (Signed)
 Please, let pt know that his US  thyroid  was normal and without discrete thyroid  nodules.

## 2024-03-28 ENCOUNTER — Telehealth: Payer: Self-pay

## 2024-03-28 ENCOUNTER — Ambulatory Visit: Attending: Neurology

## 2024-03-28 DIAGNOSIS — R531 Weakness: Secondary | ICD-10-CM | POA: Insufficient documentation

## 2024-03-28 DIAGNOSIS — R269 Unspecified abnormalities of gait and mobility: Secondary | ICD-10-CM | POA: Insufficient documentation

## 2024-03-28 DIAGNOSIS — R2681 Unsteadiness on feet: Secondary | ICD-10-CM | POA: Insufficient documentation

## 2024-03-28 DIAGNOSIS — R262 Difficulty in walking, not elsewhere classified: Secondary | ICD-10-CM | POA: Insufficient documentation

## 2024-03-28 NOTE — Telephone Encounter (Signed)
 PT reached out to pt, pt's mother (caregiver) due to missed visit/no-show today. Pt's mom reports she has been sick and forgot about her son's PT appointment. PT provided info regarding next visit date/time. Pt's mother confirms plan to attend next appointment.  Darryle Patten PT, DPT

## 2024-03-30 ENCOUNTER — Ambulatory Visit: Admitting: Physical Therapy

## 2024-03-30 DIAGNOSIS — R262 Difficulty in walking, not elsewhere classified: Secondary | ICD-10-CM

## 2024-03-30 DIAGNOSIS — R2681 Unsteadiness on feet: Secondary | ICD-10-CM | POA: Diagnosis present

## 2024-03-30 DIAGNOSIS — R531 Weakness: Secondary | ICD-10-CM | POA: Diagnosis present

## 2024-03-30 DIAGNOSIS — R269 Unspecified abnormalities of gait and mobility: Secondary | ICD-10-CM

## 2024-03-30 NOTE — Therapy (Signed)
 OUTPATIENT PHYSICAL THERAPY NEURO TREATMENT   Patient Name: Tony Harper MRN: [id number] DOB:Jan 03, 1996, 28 y.o., male Today's Date: 03/30/2024   PCP: Ostwalt, Janna, PA-C REFERRING PROVIDER: Lane Arthea BRAVO, MD   END OF SESSION:   PT End of Session - 03/30/24 1403     Visit Number 2    Number of Visits 24    Date for PT Re-Evaluation 06/06/24    PT Start Time 1403    PT Stop Time 1445    PT Time Calculation (min) 42 min    Equipment Utilized During Treatment Gait belt    Activity Tolerance Patient tolerated treatment well    Behavior During Therapy WFL for tasks assessed/performed           Past Medical History:  Diagnosis Date   Cerebral palsy (HCC)    Cysts of right lower eyelid 02/16/2017   Added automatically from request for surgery [id number]     Added automatically from request for surgery [id number]   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)    Neuromuscular disorder (HCC)    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 Patient Active Problem List   Diagnosis Date Noted   Gastroesophageal reflux disease without esophagitis 03/02/2024   Tinnitus of both ears 03/02/2024   Enlarged thyroid  gland 03/02/2024   Acute non-recurrent pansinusitis 08/02/2023   Seasonal allergic rhinitis 08/02/2023   Anxiety and depression 04/08/2023   Encounter for therapeutic drug monitoring 10/17/2018   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) 06/23/2018   Cerebral palsy (HCC) 06/23/2018   Seizure disorder (HCC) 01/09/2018    ONSET DATE: ~Gradually over the past 2 years  REFERRING DIAG: R29.898 (ICD-10-CM) - Leg weakness   THERAPY DIAG:  Difficulty in walking  Unsteadiness on feet  Abnormality of gait  Generalized weakness  Rationale for Evaluation and Treatment: Rehabilitation  SUBJECTIVE:                                                                                                                                                                                              SUBJECTIVE STATEMENT:  Pt's mother apologizes for pt missing his apt on Tuesday because she has had a sinus infection. Pt reports he has been doing his standing marches at counter in the morning and at night. Denies falls. Denies any other updates at this time.  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 Pt's mother noticed pt's strength and balance has declined gradually over the past 2 years. Patient's mother reports pt has R inattention/neglect. Pt's mother reports pt has R hand contracture and requests pt receive OT to address this.  Pt's mother reports previously pt could walk independently, although slow with a wide gait and excessive forward trunk flexed posture, but now he is unable to walk without constant L HHA. Pt's mother reports pt previously had a posterior walker, but he fell forward out of it. Mother reports pt also tried a RW and the same thing happened with pt having anterior LOB pushing AD too far out in front of himself (she states same thing happens if patient tries to walk with a buggy in the store).   Pt's mother reports when pt navigating steps she provides B HHA rather than him using HRs; otherwise, he will bump up/down the stairs on his bottom..  Patient stating he wants to be able to walk without needing HHA from his mother.   Per Neurology MD note from 02/03/2024: Patient is here to establish care and for management of anti epileptics. Patient has PMH of cerebral palsy and has been on Carbamazepine  and Dilantin  since 2019 for seizures. Seizures can be triggered by excessive sleep or breathing. Patient was released from [facility] when seizure frequency decreased. Presents today with well-managed seizures. Last known  seizure was in November 2023. Seizures present as petite grand mal, full grand mal, or absence seizures. His mother reports he will aspirate and get pneumonia with seizures. Recent episode of full-body muscle twitching on 02/03/2024, occurring intermittently throughout the night last night. Wearing a heart monitor to evaluate 10/10 chest pain and SOB worsening for the past month. Difficulty staying asleep. He has been depressed.  ..SABRASABRAPatient with concerns of falls, imbalance, and weakness...SABRASABRAReferral to PT for leg weakness and gait training   Per Cardiology note on 02/16/2024: Recent evaluation with normal echo, cardiac monitor showed sinus rhythm without any significant arrhythmias. Stable from cardiac standpoint. Blood pressure well-controlled and euvolemic on exam.    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 Yes: Pain description: patient reports potential discomfort, but unable to clarify. Pt presents with no indication of pain during session.   PRECAUTIONS: Fall and Other: hx of seizures (chronic and stable)  RED FLAGS: None   WEIGHT BEARING RESTRICTIONS: No  FALLS: Has patient fallen in last 6 months? Yes. Number of falls mother reports pt has frequent falls when he gets up in the middle of the night to go to bathroom by himself ~1x every 2 weeks  LIVING ENVIRONMENT: Lives with: lives with their family and mother and father Lives in: House/apartment Stairs: Yes: Internal: 5 steps; bilateral but cannot reach both and External: 2 steps; using B HHA from pt's mother Has following equipment at home: shower chair CLOF: Patient scoots on his buttocks to navigate indoor stairs because the handrails are not close enough to reach. Patient using B HHA from his mother to navigate external stairs. Pt requires constant L HHA from his mother to ambulate (otherwise  according to pt's mother, he is flexed forward so much that he is almost in quadruped). Mother reports pt can perform stand pivot transfers from EOB to gaming chair mod-I to wheel around in his room in the chair.   PLOF: Independent with household mobility without device, Independent with gait, Independent  with transfers, and Needs assistance with ADLs  PATIENT GOALS: walk independently without always needing HHA from his mother, get his R leg as strong his L leg  OBJECTIVE:  Note: Objective measures were completed at Evaluation unless otherwise noted.  DIAGNOSTIC FINDINGS: N/A  COGNITION: Overall cognitive status: History of cognitive impairments - at baseline    SENSATION: Light touch: WFL Difficulty following commands for testing, but pt's mother reports no known sensory changes  COORDINATION: Impaired with wide based gait and R LE incoordination greater than L LE  EDEMA:  Not formally assessed, but none observed  MUSCLE TONE: RLE: Within functional limits in sitting; however, unsure of potential tone contributions to functional movements  MUSCLE LENGTH: Not formally assessed   DTRs:  Not formally assessed   POSTURE: rounded shoulders, forward head, anterior pelvic tilt, and excessive anterior pelvis tilt with forward trunk flexion  LOWER EXTREMITY ROM:     Active  Right Eval Left Eval  Hip flexion Hips move in excessive hip external rotation during hip flexion Hips move in excessive hip external rotation during hip flexion  Hip extension    Hip abduction    Hip adduction    Hip internal rotation    Hip external rotation    Knee flexion    Knee extension    Ankle dorsiflexion    Ankle plantarflexion    Ankle inversion    Ankle eversion     (Blank rows = not tested)  LOWER EXTREMITY MMT:    MMT Right Eval Left Eval  Hip flexion 4 5  Hip extension    Hip abduction    Hip adduction    Hip internal rotation    Hip external rotation    Knee flexion 4 5   Knee extension 4+ 5  Ankle dorsiflexion 4 5  Ankle plantarflexion 4 5  Ankle inversion    Ankle eversion    *pt noted to have some shaking movements during testing  (Blank rows = not tested)  Manual Muscle Test Scale 0/5 = No muscle contraction can be seen or felt 1/5 = Contraction can be felt, but there is no motion 2-/5 = Part moves through incomplete ROM w/ gravity decreased 2/5 = Part moves through complete ROM w/ gravity decreased 2+/5 = Part moves through incomplete ROM (<50%) against gravity or through complete ROM w/ gravity 3-/5 = Part moves through incomplete ROM (>50%) against gravity 3/5 = Part moves through complete ROM against gravity 3+/5 = Part moves through complete ROM against gravity/slight resistance 4-/5= Holds test position against slight to moderate pressure 4/5 = Part moves through complete ROM against gravity/moderate resistance 4+/5= Holds test position against moderate to strong pressure 5/5 = Part moves through complete ROM against gravity/full resistance  BED MOBILITY:  Findings: Sit to supine need to assess Supine to sit need to assess Mother reports pt with some challenges managing covers independently  TRANSFERS: Sit to stand: CGA and Min A  Assistive device utilized: L HHA     Stand to sit: CGA and Min A  Assistive device utilized: L HHA     Chair to chair: CGA and Min A  Assistive device utilized: L HHA       RAMP:  Not tested  CURB:  Not tested  STAIRS: Findings: Level of Assistance: Min A,  Stair Negotiation Technique: Step to Pattern vs Alternating Pattern  Forwards with Bilateral Rails Number of Stairs: 8, Height of Stairs: 6     Comments: On ascent,  gradually has increased anterior trunk lean and then regresses from reciprocal to step-to pattern leading with R LE; step-to pattern on descent leading with L LE Pt may have excessive trunk flexion due to difficulty performing hip flexion to lift LEs onto step vs potential synergy  pattern vs tonal pattern? Pt able to descent with lighter min A   GAIT: Findings: Gait Characteristics:Decreased arm swing- Right, decreased step length- Right, decreased step length- Left, decreased stride length, ataxic, trunk flexed, wide BOS, and abducted- Right,  Distance walked: ~14ft  Assistive device utilized:L HHA Level of assistance: Min A,  Comments: attempted to hover L HHA to challenge dynamic gait and progression to decreased reliance on L UE support; however, pt very fearful of this Very wide based gait and when pt doesn't feel secure he widens base even more by stepping R LE out laterally, short step lengths bilaterally, maintains trunk upright when using L HHA but with fatigue or fear of falling starts to flex trunk forward  FUNCTIONAL TESTS:  5 times sit to stand: need to assess Timed up and go (TUG): need to assess 6 minute walk test: need to assess 10 meter walk test: 0.14m/s using L HHA for min A (19.18 seconds)  Berg Balance Test: need to assess  PATIENT SURVEYS:   Stroke Impact Scale 16 (Copyrighted instrument, [employer])  In the past 2 weeks, how difficult was it to...  Rating Scale 5 = Not difficult at all 4 = A little difficult 3 = Somewhat difficult 2 = Very difficult 1 = Could not do at all  a. Dress the top part of your body? 1  b. Bathe yourself? 2  c. Get to the toilet on time? 3  d. Control your bladder (not have an accident)? 5  e. Control your bowels (not have an accident)? 5  f. Stand without losing balance? 2  g. Go shopping? 2  h. Do heavy household chores (e.g. vacuum, laundry or  yard work)? 1  i. Stay sitting without losing your balance? 4  j. Walk without losing your balance? 2  k. Move from a bed to a chair? 3  l. Walk fast? 2  m. Climb one flight of stairs? 1  n. Walk one block? 2  o. Get in and out of a car? 3  p. Carry heavy objects (e.g. bag of groceries) with your  affected hand? 1  Sum:   39/80 *completed by patient's mother*  MDC (Minimal Detectable Change) is >/=8                                                                                                                               TREATMENT DATE: 03/30/2024  Pt arrives with his mother and is eager to participate.   Gait training ~170ft into clinic using L HHA from therapist providing skilled min assist for balance support - pt continues to demo wide BOS, decreased step lengths  bilaterally, decreased gait speed, min ataxic/incoordinated movements of  BLEs, and min forward trunk flexion (but improved compared to last session)   6 Min Walk Test:  Instructed patient to ambulate as quickly and as safely as possible for 6 minutes using LRAD. Patient was allowed to take standing rest breaks without stopping the test, but if the patient required a sitting rest break the clock would be stopped and the test would be over.  Results: 653 feet (199 meters, Avg speed 0.55 m/s) using L HHA providing min A. Results indicate that the patient has reduced endurance with ambulation compared to age matched norms.  Patient demonstrates: Wide BOS  Nervous when turning Decreased head rotation to visually scan environment Uses L HHA to maintain trunk upright throughout, no significant fatigue nor worsening of anterior trunk lean into HHA Age Matched Norms: 32-69 yo M: 51 F: 61, 67-79 yo M: 1 F: 471, 71-89 yo M: 417 F: 392 MDC: 58.21 meters (190.98 feet) or 50 meters (ANPTA Core Set of Outcome Measures for Adults with Neurologic Conditions, 2018)    Patient participated in [employer] Balance Test and demonstrates increased fall risk as noted by score of   17/56.  (<36= high risk for falls, close to 100%; 37-45 significant >80%; 46-51 moderate >50%; 52-55 lower >25%). Educated pt and mother on results of assessment.   [facility] PT Assessment - 03/30/24 0001       Standardized Balance Assessment   Standardized Balance Assessment Berg Balance Test       Berg Balance Test   Sit to Stand Able to stand  independently using hands   slightly uses backs of legs against chair   Standing Unsupported Needs several tries to stand 30 seconds unsupported    Sitting with Back Unsupported but Feet Supported on Floor or Stool Able to sit safely and securely 2 minutes    Stand to Sit Sits safely with minimal use of hands    Transfers Needs one person to assist    Standing Unsupported with Eyes Closed Needs help to keep from falling    Standing Unsupported with Feet Together Needs help to attain position and unable to hold for 15 seconds    From Standing, Reach Forward with Outstretched Arm Loses balance while trying/requires external support   doesn't fully achieve rhomberg stance (feet ~10-12inches apart) but can stand 20-30sec in this position   From Standing Position, Pick up Object from Floor Able to pick up shoe, needs supervision    From Standing Position, Turn to Look Behind Over each Shoulder Needs assist to keep from losing balance and falling    Turn 360 Degrees Needs assistance while turning    Standing Unsupported, Alternately Place Feet on Step/Stool Able to complete >2 steps/needs minimal assist    Standing Unsupported, One Foot in [employer] balance while stepping or standing    Standing on One Leg Unable to try or needs assist to prevent fall    Total Score 17          Stair navigation training ascending/descending 8 steps (6 height) using B HRs with skilled min assist for balance - starts with reciprocal pattern and then regresses to step-to pattern leading with R LE on ascent and L LE on descent with fatigue and/or increased nervousness. Continues to have progressively more trunk flexed posture during ascent, but improved foot clearance to step onto next step today compared to at initial eval.   Gait training ~82ft using L HHA with min  assist for balance with goal of decreasing L UE support by having pt hold therapist's finger,  but therapist not providing support through it - pt continues to demo improving upright posture today with less wide of a BOS and larger steps for reciprocal stepping pattern. Pt still not feeling comfortable/confident gait training without UE support at this time.   Continued to determine that patient's wide BOS and regression into forward trunk flexed posture when standing/ambulating comes from decreased confident with his balance.   PATIENT EDUCATION: Education details: PT POC, findings on assessment, HEP Person educated: Patient and Mother Education method: Explanation and Handouts Education comprehension: verbalized understanding and needs further education  HOME EXERCISE PROGRAM: Access Code: [email or web address] URL: [email or web address] Date: 03/14/2024 Prepared by: Connell Kiss  Exercises - Step Taps with Counter Support  - 1 x daily - 7 x weekly - 2 sets - 10 reps  GOALS: Goals reviewed with patient? Yes  SHORT TERM GOALS: Target date: 04/25/2024  Patient will be independent with home exercise program to improve strength/mobility for increased functional independence with ADLs and mobility.  Baseline: initiated on 7/22 and pt will require assistance from his mother Goal status: INITIAL   LONG TERM GOALS: Target date: 06/06/2024  Patient will increase Berg Balance score to > 51/56 to demonstrate improved balance and decreased fall risk during functional activities and ADLs.  Baseline: 03/30/2024: 17/56 Goal status: INITIAL  2.  Patient will increase six minute walk test distance to >1098ft, using LRAD with no more than SBA, for progression to more independent community level ambulation, demonstrating improved gait endurance. Baseline: 03/30/2024: 653 feet (199 meters, Avg speed 0.55 m/s) using L HHA providing min A. Goal status: INITIAL  3.  Patient will increase 10 meter walk test to >1.49m/s using LRAD mod-I as to improve gait speed for better community ambulation and  to reduce fall risk. Baseline: 0.28m/s using L HHA for min A  Goal status: INITIAL  4.  Patient will improve Stroke Impact Scale-16 by >9 points indicating patient experiencing increased independence with functional mobility tasks and ADLs to improve quality of life and decrease caregiver burden. (Questionnaire to be completed by his mother) Baseline: 39/80 Goal status: INITIAL  5.  Patient will be modified independent with household level ambulation using LRAD to improve QOL, decrease fall risk, and promote overall increased independence in the home.  Baseline: currently requires constant L HHA to ambulate Goal status: INITIAL  6.  Patient will be able to ascend/descend 4 steps using 1 HR support and no more than SBA to increase his independence with home entry.  Baseline: min A and B HR or B HHA support  Goal status: INITIAL  ASSESSMENT:  CLINICAL IMPRESSION:  Patient is a 28 y.o. male who was seen today for physical therapy treatment for worsening imbalance, incoordination, and R hemiparesis that have resulted in a significant decline in patient's independence. Performed , which demonstrates patient with significantly reduced gait endurance as well as significantly impaired gait speed over this duration of time, which limits his ability to participate in community level activities. Patient also demonstrates high fall risk on Berg Balance Test with patient having increased fear of falling during any standing tasks without L HHA. Therapeutic interventions continued to focus on increasing patient's confidence and stability during standing balance, independence with stair navigation and gait training without reliance on L HHA from +2 assit. Patient is highly motivated to improve his mobility and regain his independence. The pt will benefit from further skilled  PT to improve these deficits in order to increase QOL, decrease fall risk, and ease/safety with ADLs while decreasing caregiver burden.      OBJECTIVE IMPAIRMENTS: Abnormal gait, decreased activity tolerance, decreased balance, decreased cognition, decreased coordination, decreased endurance, decreased knowledge of use of DME, decreased mobility, difficulty walking, decreased strength, and impaired vision/preception.   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   REHAB POTENTIAL: Good  CLINICAL DECISION MAKING: Evolving/moderate complexity  EVALUATION COMPLEXITY: Moderate  PLAN:  PT FREQUENCY: 1-2x/week  PT DURATION: 12 weeks  PLANNED INTERVENTIONS: [id number]- PT Re-evaluation, 97750- Physical Performance Testing, 97110-Therapeutic exercises, 97530- Therapeutic activity, [id number]- Neuromuscular re-education, 97535- Self Care, [id number]- Manual therapy, [id number]- Gait training, [phone]- Orthotic Initial, [phone]- Orthotic/Prosthetic subsequent, [phone]- Canalith repositioning, [phone]- Electrical stimulation (manual), Patient/Family education, Balance training, Stair training, Taping, Joint mobilization, Spinal mobilization, Vestibular training, Visual/preceptual remediation/compensation, Cognitive remediation, DME instructions, Cryotherapy, Moist heat, and Biofeedback  PLAN FOR NEXT SESSION:  - progress HEP  - standing balance  - progression towards more normal BOS (stands with wide base - especially when nervous)  - reaching and head turning in standing ([employer]) - gait training with decreased reliance on L HHA  - try walking  stick?  - try new litegait when it arrives?    Connell Kiss, PT, DPT, NCS, CSRS Physical Therapist - [place]  [facility]  2:48 PM 03/30/24

## 2024-04-04 ENCOUNTER — Ambulatory Visit: Admitting: Physical Therapy

## 2024-04-04 DIAGNOSIS — R2681 Unsteadiness on feet: Secondary | ICD-10-CM | POA: Diagnosis not present

## 2024-04-04 DIAGNOSIS — R531 Weakness: Secondary | ICD-10-CM

## 2024-04-04 DIAGNOSIS — R262 Difficulty in walking, not elsewhere classified: Secondary | ICD-10-CM

## 2024-04-04 DIAGNOSIS — R269 Unspecified abnormalities of gait and mobility: Secondary | ICD-10-CM

## 2024-04-04 NOTE — Therapy (Signed)
 OUTPATIENT PHYSICAL THERAPY NEURO TREATMENT   Patient Name: Tony Harper MRN: [id number] DOB:12-07-1995, 28 y.o., male Today's Date: 04/04/2024   PCP: Dineen Channel, PA-C REFERRING PROVIDER: Lane Arthea BRAVO, MD   END OF SESSION:   PT End of Session - 04/04/24 1404     Visit Number 3    Number of Visits 24    Date for PT Re-Evaluation 06/06/24    PT Start Time 1406    PT Stop Time 1446    PT Time Calculation (min) 40 min    Equipment Utilized During Treatment Gait belt    Activity Tolerance Patient tolerated treatment well    Behavior During Therapy WFL for tasks assessed/performed            Past Medical History:  Diagnosis Date   Cerebral palsy (HCC)    Cysts of right lower eyelid 02/16/2017   Added automatically from request for surgery [id number]     Added automatically from request for surgery [id number]   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)    Neuromuscular disorder (HCC)    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 Patient Active Problem List   Diagnosis Date Noted   Gastroesophageal reflux disease without esophagitis 03/02/2024   Tinnitus of both ears 03/02/2024   Enlarged thyroid  gland 03/02/2024   Acute non-recurrent pansinusitis 08/02/2023   Seasonal allergic rhinitis 08/02/2023   Anxiety and depression 04/08/2023   Encounter for therapeutic drug monitoring 10/17/2018   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) 06/23/2018   Cerebral palsy (HCC) 06/23/2018   Seizure disorder (HCC) 01/09/2018    ONSET DATE: ~Gradually over the past 2 years  REFERRING DIAG: R29.898 (ICD-10-CM) - Leg weakness   THERAPY DIAG:  Unsteadiness on feet  Difficulty in walking  Abnormality of gait  Generalized weakness  Rationale for Evaluation and Treatment: Rehabilitation  SUBJECTIVE:                                                                                                                                                                                              SUBJECTIVE STATEMENT:  Pt reports he is doing pretty good. Pt's mother states pt's new care manager is setting up plans for patient to go out in the community 1-2x/week. Pt's mother states the care manager is also planning to assist with helping patient in getting a job or going to a day program. Pt's mother confirms pt is still requiring constant HHA for gait, unless he gets up to go to  bathroom at night, then he walks unassisted. Pt's mother states pt has not had any falls.   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 Pt's mother noticed pt's strength and balance has declined gradually over the past 2 years. Patient's mother reports pt has R inattention/neglect. Pt's mother reports pt has R hand contracture and requests pt receive OT to address this.  Pt's mother reports previously pt could walk independently, although slow with a wide gait and excessive forward trunk flexed posture, but now he is unable to walk without constant L HHA. Pt's mother reports pt previously had a posterior walker, but he fell forward out of it. Mother reports pt also tried a RW and the same thing happened with pt having anterior LOB pushing AD too far out in front of himself (she states same thing happens if patient tries to walk with a buggy in the store).   Pt's mother reports when pt navigating steps she provides B HHA rather than him using HRs; otherwise, he will bump up/down the stairs on his bottom..  Patient stating he wants to be able to walk without needing HHA from his mother.   Per Neurology MD note from 02/03/2024: Patient is here to establish care and for management of anti epileptics. Patient has PMH of cerebral palsy and has been on Carbamazepine  and  Dilantin  since 2019 for seizures. Seizures can be triggered by excessive sleep or breathing. Patient was released from [facility] when seizure frequency decreased. Presents today with well-managed seizures. Last known seizure was in November 2023. Seizures present as petite grand mal, full grand mal, or absence seizures. His mother reports he will aspirate and get pneumonia with seizures. Recent episode of full-body muscle twitching on 02/03/2024, occurring intermittently throughout the night last night. Wearing a heart monitor to evaluate 10/10 chest pain and SOB worsening for the past month. Difficulty staying asleep. He has been depressed.  ..SABRASABRAPatient with concerns of falls, imbalance, and weakness...SABRASABRAReferral to PT for leg weakness and gait training   Per Cardiology note on 02/16/2024: Recent evaluation with normal echo, cardiac monitor showed sinus rhythm without any significant arrhythmias. Stable from cardiac standpoint. Blood pressure well-controlled and euvolemic on exam.    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 Yes: Pain description: patient reports potential discomfort, but unable to clarify. Pt presents with no indication of pain during session.   PRECAUTIONS: Fall and Other: hx of seizures (chronic and stable)  RED FLAGS: None   WEIGHT BEARING RESTRICTIONS: No  FALLS: Has patient fallen in last 6 months? Yes. Number of falls mother reports pt has frequent falls when he gets up in the middle of the night to go to bathroom by himself ~1x every 2 weeks  LIVING ENVIRONMENT: Lives with: lives with their family and mother and father Lives in: House/apartment Stairs: Yes: Internal: 5 steps; bilateral but cannot reach both and External: 2 steps; using B HHA from pt's mother Has following equipment at home: shower chair CLOF: Patient scoots on his  buttocks to navigate indoor stairs because the handrails are not close enough to reach. Patient using B HHA from his mother to navigate external stairs. Pt requires constant L HHA from his mother to ambulate (otherwise according to pt's mother, he is flexed forward  so much that he is almost in quadruped). Mother reports pt can perform stand pivot transfers from EOB to gaming chair mod-I to wheel around in his room in the chair.   PLOF: Independent with household mobility without device, Independent with gait, Independent with transfers, and Needs assistance with ADLs  PATIENT GOALS: walk independently without always needing HHA from his mother, get his R leg as strong his L leg  OBJECTIVE:  Note: Objective measures were completed at Evaluation unless otherwise noted.  DIAGNOSTIC FINDINGS: N/A  COGNITION: Overall cognitive status: History of cognitive impairments - at baseline    SENSATION: Light touch: WFL Difficulty following commands for testing, but pt's mother reports no known sensory changes  COORDINATION: Impaired with wide based gait and R LE incoordination greater than L LE  EDEMA:  Not formally assessed, but none observed  MUSCLE TONE: RLE: Within functional limits in sitting; however, unsure of potential tone contributions to functional movements  MUSCLE LENGTH: Not formally assessed   DTRs:  Not formally assessed   POSTURE: rounded shoulders, forward head, anterior pelvic tilt, and excessive anterior pelvis tilt with forward trunk flexion  LOWER EXTREMITY ROM:     Active  Right Eval Left Eval  Hip flexion Hips move in excessive hip external rotation during hip flexion Hips move in excessive hip external rotation during hip flexion  Hip extension    Hip abduction    Hip adduction    Hip internal rotation    Hip external rotation    Knee flexion    Knee extension    Ankle dorsiflexion    Ankle plantarflexion    Ankle inversion    Ankle eversion     (Blank  rows = not tested)  LOWER EXTREMITY MMT:    MMT Right Eval Left Eval  Hip flexion 4 5  Hip extension    Hip abduction    Hip adduction    Hip internal rotation    Hip external rotation    Knee flexion 4 5  Knee extension 4+ 5  Ankle dorsiflexion 4 5  Ankle plantarflexion 4 5  Ankle inversion    Ankle eversion    *pt noted to have some shaking movements during testing  (Blank rows = not tested)  Manual Muscle Test Scale 0/5 = No muscle contraction can be seen or felt 1/5 = Contraction can be felt, but there is no motion 2-/5 = Part moves through incomplete ROM w/ gravity decreased 2/5 = Part moves through complete ROM w/ gravity decreased 2+/5 = Part moves through incomplete ROM (<50%) against gravity or through complete ROM w/ gravity 3-/5 = Part moves through incomplete ROM (>50%) against gravity 3/5 = Part moves through complete ROM against gravity 3+/5 = Part moves through complete ROM against gravity/slight resistance 4-/5= Holds test position against slight to moderate pressure 4/5 = Part moves through complete ROM against gravity/moderate resistance 4+/5= Holds test position against moderate to strong pressure 5/5 = Part moves through complete ROM against gravity/full resistance  BED MOBILITY:  Findings: Sit to supine need to assess Supine to sit need to assess Mother reports pt with some challenges managing covers independently  TRANSFERS: Sit to stand: CGA and Min A  Assistive device utilized: L HHA     Stand to sit: CGA and Min A  Assistive device utilized: L HHA     Chair to chair: CGA and Min A  Assistive device utilized: L HHA       RAMP:  Not  tested  CURB:  Not tested  STAIRS: Findings: Level of Assistance: Min A,  Stair Negotiation Technique: Step to Pattern vs Alternating Pattern  Forwards with Bilateral Rails Number of Stairs: 8, Height of Stairs: 6     Comments: On ascent, gradually has increased anterior trunk lean and then regresses from  reciprocal to step-to pattern leading with R LE; step-to pattern on descent leading with L LE Pt may have excessive trunk flexion due to difficulty performing hip flexion to lift LEs onto step vs potential synergy pattern vs tonal pattern? Pt able to descent with lighter min A   GAIT: Findings: Gait Characteristics:Decreased arm swing- Right, decreased step length- Right, decreased step length- Left, decreased stride length, ataxic, trunk flexed, wide BOS, and abducted- Right,  Distance walked: ~14ft  Assistive device utilized:L HHA Level of assistance: Min A,  Comments: attempted to hover L HHA to challenge dynamic gait and progression to decreased reliance on L UE support; however, pt very fearful of this Very wide based gait and when pt doesn't feel secure he widens base even more by stepping R LE out laterally, short step lengths bilaterally, maintains trunk upright when using L HHA but with fatigue or fear of falling starts to flex trunk forward  FUNCTIONAL TESTS:  5 times sit to stand: need to assess Timed up and go (TUG): need to assess 6 minute walk test: need to assess 10 meter walk test: 0.92m/s using L HHA for min A (19.18 seconds)  Berg Balance Test: need to assess  PATIENT SURVEYS:   Stroke Impact Scale 16 (Copyrighted instrument, [facility])  In the past 2 weeks, how difficult was it to...  Rating Scale 5 = Not difficult at all 4 = A little difficult 3 = Somewhat difficult 2 = Very difficult 1 = Could not do at all  a. Dress the top part of your body? 1  b. Bathe yourself? 2  c. Get to the toilet on time? 3  d. Control your bladder (not have an accident)? 5  e. Control your bowels (not have an accident)? 5  f. Stand without losing balance? 2  g. Go shopping? 2  h. Do heavy household chores (e.g. vacuum, laundry or  yard work)? 1  i. Stay sitting without losing your balance? 4  j. Walk without losing your balance? 2  k. Move from a bed  to a chair? 3  l. Walk fast? 2  m. Climb one flight of stairs? 1  n. Walk one block? 2  o. Get in and out of a car? 3  p. Carry heavy objects (e.g. bag of groceries) with your  affected hand? 1  Sum:  39/80 *completed by patient's mother*  MDC (Minimal Detectable Change) is >/=8  TREATMENT DATE: 04/04/2024  Pt arrives with his mother and is eager to participate.   Unless otherwise stated, CGA/light min A was provided and gait belt donned in order to ensure pt safety throughout session.  Gait training ~131ft into clinic using L HHA from therapist providing skilled min assist for balance support - pt continues to demo wide BOS, decreased step lengths bilaterally, decreased gait speed, min ataxic/incoordinated movements of  BLEs, and min forward trunk flexion (but continues to improve)  Gait training 462ft using L HHA progressed to holding PVC pipe with therapist also supporitng pipe for patient security with skilled light min assist for balance. Pt demonstrating the following gait deviations with therapist providing the described cuing and facilitation for improvement:  Wide BOS Shorter step lengths Minor anterior trunk flexion Both of these become more pronounced with gait through doorways when pt having to navigate obstacle or when pt becoming fearful Therapist providing gradually decreased support through L UE to decrease reliance on this for gait  Introduced idea of using a walking stick during gait with goal of progressing towards mod-I gait, without reliance on HHA from another person.   Gait training 315ft + ~46ft x2 to/from kitchen using L walking stick with CGA/light min assist for balance and facilitating R/L weight shifting onto stance limbs. Started with therapist facilitating L HHA on the walking stick to improve sequencing and proper AD use Progressed  to patient independently managing walking stick with therapist only providing CGA/light min A for steadying balance  Continues to demo similar gait mechanics as when using LHHA Pt continues to have increased fear of falling when realizing therapist is providing decreased support   Dynamic standing balance at kitchen counter with use of Blaze Pods as external targets:  2 colors on random setting with goal of hitting blue with R hand and green with L hand 50 hits in 36min30seconds Pt maintains wide BOS and has excessive forward trunk flexion when reaching targets further away Light min A for balance especially with increased reaching distance  Patient currently reluctant to use of walking stick, but hopeful with increased exposure will gain confidence and realize it allows him increasing independence.    PATIENT EDUCATION: Education details: PT POC, findings on assessment, HEP Person educated: Patient and Mother Education method: Explanation and Handouts Education comprehension: verbalized understanding and needs further education  HOME EXERCISE PROGRAM: Access Code: [email or web address] URL: [email or web address] Date: 03/14/2024 Prepared by: Connell Kiss  Exercises - Step Taps with Counter Support  - 1 x daily - 7 x weekly - 2 sets - 10 reps  GOALS: Goals reviewed with patient? Yes  SHORT TERM GOALS: Target date: 04/25/2024  Patient will be independent with home exercise program to improve strength/mobility for increased functional independence with ADLs and mobility.  Baseline: initiated on 7/22 and pt will require assistance from his mother Goal status: INITIAL   LONG TERM GOALS: Target date: 06/06/2024  Patient will increase Berg Balance score to > 51/56 to demonstrate improved balance and decreased fall risk during functional activities and ADLs.  Baseline: 03/30/2024: 17/56 Goal status: INITIAL  2.  Patient will increase six minute walk test distance to >1045ft, using  LRAD with no more than SBA, for progression to more independent community level ambulation, demonstrating improved gait endurance. Baseline: 03/30/2024: 653 feet (199 meters, Avg speed 0.55 m/s) using L HHA providing min A. Goal status: INITIAL  3.  Patient will increase 10 meter walk test to >1.32m/s using LRAD mod-I as to improve  gait speed for better community ambulation and to reduce fall risk. Baseline: 0.85m/s using L HHA for min A  Goal status: INITIAL  4.  Patient will improve Stroke Impact Scale-16 by >9 points indicating patient experiencing increased independence with functional mobility tasks and ADLs to improve quality of life and decrease caregiver burden. (Questionnaire to be completed by his mother) Baseline: 39/80 Goal status: INITIAL  5.  Patient will be modified independent with household level ambulation using LRAD to improve QOL, decrease fall risk, and promote overall increased independence in the home.  Baseline: currently requires constant L HHA to ambulate Goal status: INITIAL  6.  Patient will be able to ascend/descend 4 steps using 1 HR support and no more than SBA to increase his independence with home entry.  Baseline: min A and B HR or B HHA support  Goal status: INITIAL  ASSESSMENT:  CLINICAL IMPRESSION:  Patient is a 28 y.o. male who was seen today for physical therapy treatment for worsening imbalance, incoordination, and R hemiparesis that have resulted in a significant decline in patient's independence. Therapy session focused on gait training with decreasing reliance on L HHA from another person and trialing walking stick to provide L UE support while promoting upright posture (pt previously tried posterior walker & anterior walkers but both resulted in anterior LOB). Patient progressed from requiring constant skilled min A for balance with L HHA to use of walking stick and only light min A from therapist for balance stability, specifically when he becomes  nervous from realizing he is ambulating without as much assistance. Also focused on dynamic standing balance intervention of reaching mildly outside BOS to tap Blaze Pods with goal of standing with gradually more narrow BOS as pt stands with excessively wide BOS in order to improve his stability and confidence with his balance. Patient is highly motivated to improve his mobility and regain his independence. The pt will benefit from further skilled PT to improve these deficits in order to increase QOL, decrease fall risk, and ease/safety with ADLs while decreasing caregiver burden.     OBJECTIVE IMPAIRMENTS: Abnormal gait, decreased activity tolerance, decreased balance, decreased cognition, decreased coordination, decreased endurance, decreased knowledge of use of DME, decreased mobility, difficulty walking, decreased strength, and impaired vision/preception.   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   REHAB POTENTIAL: Good  CLINICAL DECISION MAKING: Evolving/moderate complexity  EVALUATION COMPLEXITY: Moderate  PLAN:  PT FREQUENCY: 1-2x/week  PT DURATION: 12 weeks  PLANNED INTERVENTIONS: 97164- PT Re-evaluation, 97750- Physical Performance Testing, 97110-Therapeutic exercises, 97530- Therapeutic activity, [id number]- Neuromuscular re-education, 97535- Self Care, [id number]- Manual therapy, [id number]- Gait training, [phone]- Orthotic Initial, [phone]- Orthotic/Prosthetic subsequent, [phone]- Canalith  repositioning, [phone]- Electrical stimulation (manual), Patient/Family education, Balance training, Stair training, Taping, Joint mobilization, Spinal mobilization, Vestibular training, Visual/preceptual remediation/compensation, Cognitive remediation, DME instructions, Cryotherapy, Moist heat, and Biofeedback  PLAN FOR NEXT SESSION:  - progress HEP  - standing balance  - progression towards more normal BOS (stands with wide base - especially when nervous)   - use of external targets to keep feet in closer BOS  - reaching and head turning in standing (Blaze Pods) - gait training  with decreased reliance on L HHA  - continue use of walking stick - try new litegait when it arrives? - stair navigation training with use of walking stick vs 1 HR to simulate home set-up    Connell Kiss, PT, DPT, NCS, CSRS Physical Therapist - [facility]  [facility]  3:02 PM 04/04/24

## 2024-04-06 ENCOUNTER — Ambulatory Visit

## 2024-04-06 DIAGNOSIS — R262 Difficulty in walking, not elsewhere classified: Secondary | ICD-10-CM

## 2024-04-06 DIAGNOSIS — R269 Unspecified abnormalities of gait and mobility: Secondary | ICD-10-CM

## 2024-04-06 DIAGNOSIS — R2681 Unsteadiness on feet: Secondary | ICD-10-CM | POA: Diagnosis not present

## 2024-04-06 DIAGNOSIS — R531 Weakness: Secondary | ICD-10-CM

## 2024-04-06 NOTE — Therapy (Signed)
 OUTPATIENT PHYSICAL THERAPY TREATMENT  Patient Name: Tony Harper MRN: [id number] DOB:1995/10/05, 28 y.o., male Today's Date: 04/06/2024  PCP: Ostwalt, Janna, PA-C REFERRING PROVIDER: Lane Arthea BRAVO, MD   END OF SESSION:   PT End of Session - 04/06/24 1424     Visit Number 4    Number of Visits 24    Date for PT Re-Evaluation 06/06/24    PT Start Time 1400    PT Stop Time 1440    PT Time Calculation (min) 40 min    Equipment Utilized During Treatment Gait belt    Activity Tolerance Patient tolerated treatment well    Behavior During Therapy WFL for tasks assessed/performed           Past Medical History:  Diagnosis Date   Cerebral palsy (HCC)    Cysts of right lower eyelid 02/16/2017   Added automatically from request for surgery [id number]     Added automatically from request for surgery [id number]   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)    Neuromuscular disorder (HCC)    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 Patient Active Problem List   Diagnosis Date Noted   Gastroesophageal reflux disease without esophagitis 03/02/2024   Tinnitus of both ears 03/02/2024   Enlarged thyroid  gland 03/02/2024   Acute non-recurrent pansinusitis 08/02/2023   Seasonal allergic rhinitis 08/02/2023   Anxiety and depression 04/08/2023   Encounter for therapeutic drug monitoring 10/17/2018   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) 06/23/2018   Cerebral palsy (HCC) 06/23/2018   Seizure disorder (HCC) 01/09/2018    ONSET DATE: ~Gradually over the past 2 years  REFERRING DIAG: R29.898 (ICD-10-CM) - Leg weakness   THERAPY DIAG:  Unsteadiness on feet  Difficulty in walking  Abnormality of gait  Generalized weakness  Rationale for Evaluation and Treatment: Rehabilitation  SUBJECTIVE:                                                                                                                                                                                              SUBJECTIVE STATEMENT: Pt doing well. Got his trekking pole early this AM and has used it a bit. Lots of walking today already oprior to arrival. No other pertinent updates.   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 Pt's mother noticed pt's strength and balance has declined gradually over the past 2 years. Patient's mother reports pt has R  inattention/neglect. Pt's mother reports pt has R hand contracture and requests pt receive OT to address this. Pt's mother reports previously pt could walk independently, although slow with a wide gait and excessive forward trunk flexed posture, but now he is unable to walk without constant L HHA. Pt's mother reports pt previously had a posterior walker, but he fell forward out of it. Mother reports pt also tried a RW and the same thing happened with pt having anterior LOB pushing AD too far out in front of himself (she states same thing happens if patient tries to walk with a buggy in the store). Pt's mother reports when pt navigating steps she provides B HHA rather than him using HRs; otherwise, he will bump up/down the stairs on his bottom.Patient stating he wants to be able to walk without needing HHA from his mother. 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 No  PRECAUTIONS: Fall and Other: hx of seizures (chronic and stable)  WEIGHT BEARING RESTRICTIONS: No  FALLS: Has patient fallen in last 6 months? Yes. Number of falls mother reports pt has frequent falls when he gets up in the middle of the night to go to bathroom by himself ~1x every 2 weeks  LIVING ENVIRONMENT: Lives with: lives with their family and mother and father Lives in:  House/apartment Stairs: Yes: Internal: 5 steps; bilateral but cannot reach both and External: 2 steps; using B HHA from pt's mother Has following equipment at home: shower chair CLOF: Patient scoots on his buttocks to navigate indoor stairs because the handrails are not close enough to reach. Patient using B HHA from his mother to navigate external stairs. Pt requires constant L HHA from his mother to ambulate (otherwise according to pt's mother, he is flexed forward so much that he is almost in quadruped). Mother reports pt can perform stand pivot transfers from EOB to gaming chair mod-I to wheel around in his room in the chair.   PLOF: Independent with household mobility without device, Independent with gait, Independent with transfers, and Needs assistance with ADLs  PATIENT GOALS: walk independently without always needing HHA from his mother, get his R leg as strong his L leg  OBJECTIVE:  Note: Objective measures were completed at evaluation unless otherwise noted.                                                                                                                           TREATMENT DATE: 04/06/2024 -346ft AMB overground c LUE trekking pole ad lib (minGuard assist due to full body startle response with LOB) *needs full navigation cues due to spatial disorientation  -333ft AMB overground c LUE trekking pole ad lib (minGuard assist due to full body startle response with LOB) *needs full navigation cues due to spatial disorientation  -78ft AMB course with single object retrieval and carry: 8 items up to 3lb minGuardA  This  is way too easy -30ft AMB course with single object retrieval and carry: 5 items up to 5lb minGuardA -60ft AMB course with single object retrieval and carry: 8 items up to 5lb minGuardA with blue falls mat step over and 1/2 roll step over in pathway  -AMB to stairs c LUE then stairs up and down 3x c 2 rails support on BUE (minGuard Assist)   PATIENT  EDUCATION: Education details: PT POC, findings on assessment, HEP Person educated: Patient and Mother Education method: Explanation and Handouts Education comprehension: verbalized understanding and needs further education  HOME EXERCISE PROGRAM: Access Code: [email or web address] URL: [email or web address] Date: 03/14/2024 Prepared by: Connell Kiss  Exercises - Step Taps with Counter Support  - 1 x daily - 7 x weekly - 2 sets - 10 reps  GOALS: Goals reviewed with patient? Yes  SHORT TERM GOALS: Target date: 04/25/2024  Patient will be independent with home exercise program to improve strength/mobility for increased functional independence with ADLs and mobility.  Baseline: initiated on 7/22 and pt will require assistance from his mother Goal status: INITIAL   LONG TERM GOALS: Target date: 06/06/2024  Patient will increase Berg Balance score to > 51/56 to demonstrate improved balance and decreased fall risk during functional activities and ADLs.  Baseline: 03/30/2024: 17/56 Goal status: INITIAL  2.  Patient will increase six minute walk test distance to >1058ft, using LRAD with no more than SBA, for progression to more independent community level ambulation, demonstrating improved gait endurance. Baseline: 03/30/2024: 653 feet (199 meters, Avg speed 0.55 m/s) using L HHA providing min A. Goal status: INITIAL  3.  Patient will increase 10 meter walk test to >1.22m/s using LRAD mod-I as to improve gait speed for better community ambulation and to reduce fall risk. Baseline: 0.62m/s using L HHA for min A  Goal status: INITIAL  4.  Patient will improve Stroke Impact Scale-16 by >9 points indicating patient experiencing increased independence with functional mobility tasks and ADLs to improve quality of life and decrease caregiver burden. (Questionnaire to be completed by his mother) Baseline: 39/80 Goal status: INITIAL  5.  Patient will be modified independent with household level  ambulation using LRAD to improve QOL, decrease fall risk, and promote overall increased independence in the home.  Baseline: currently requires constant L HHA to ambulate Goal status: INITIAL  6.  Patient will be able to ascend/descend 4 steps using 1 HR support and no more than SBA to increase his independence with home entry.  Baseline: min A and B HR or B HHA support  Goal status: INITIAL  ASSESSMENT:  CLINICAL IMPRESSION:  Continued to work on household distance AMB with new device, as well as task specific, multidirectional carrying activity for ADL and homecare. Pt takes recovery breaks periodically, but does not appear too fatigued overall.The pt will benefit from further skilled PT to improve these deficits in order to increase QOL, decrease fall risk, and ease/safety with ADLs while decreasing caregiver burden.    OBJECTIVE IMPAIRMENTS: Abnormal gait, decreased activity tolerance, decreased balance, decreased cognition, decreased coordination, decreased endurance, decreased knowledge of use of DME, decreased mobility, difficulty walking, decreased strength, and impaired vision/preception.   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   REHAB POTENTIAL: Good  CLINICAL DECISION MAKING: Evolving/moderate complexity  EVALUATION COMPLEXITY: Moderate  PLAN:  PT FREQUENCY: 1-2x/week  PT DURATION: 12 weeks  PLANNED INTERVENTIONS: 97164- PT Re-evaluation, 97750- Physical  Performance Testing, 97110-Therapeutic exercises, 97530- Therapeutic activity, 97112- Neuromuscular re-education, 97535- Self Care, 02859- Manual therapy, [phone]- Gait training, [phone]- Orthotic Initial, [phone]- Orthotic/Prosthetic subsequent, [phone]- Canalith repositioning, [phone]- Electrical stimulation (manual), Patient/Family education, Balance training, Stair training, Taping, Joint mobilization, Spinal mobilization, Vestibular training, Visual/preceptual remediation/compensation, Cognitive remediation, DME instructions, Cryotherapy, Moist heat, and Biofeedback  PLAN FOR NEXT SESSION:  - progress HEP  - standing balance  - progression towards more normal BOS (stands with wide base - especially when nervous)   - use of external targets to keep feet in closer BOS  - reaching and head turning in standing (Blaze Pods) - gait training with decreased reliance on L HHA  - continue use of walking stick - try new litegait when it arrives? - stair navigation training with use of walking stick vs 1 HR to simulate home set-up  3:41 PM, 04/06/24 Peggye JAYSON Linear, PT, DPT Physical Therapist - [facility]  Outpatient Physical Therapy- Main Campus [phone]

## 2024-04-10 NOTE — Therapy (Signed)
 OUTPATIENT PHYSICAL THERAPY TREATMENT  Patient Name: Tony Harper MRN: [id number] DOB:1995/09/16, 28 y.o., male Today's Date: 04/11/2024  PCP: Ostwalt, Janna, PA-C REFERRING PROVIDER: Lane Arthea BRAVO, MD   END OF SESSION:   PT End of Session - 04/11/24 1410     Visit Number 5    Number of Visits 24    Date for PT Re-Evaluation 06/06/24    PT Start Time 1409    PT Stop Time 1443    PT Time Calculation (min) 34 min    Equipment Utilized During Treatment Gait belt    Activity Tolerance Patient tolerated treatment well    Behavior During Therapy WFL for tasks assessed/performed            Past Medical History:  Diagnosis Date   Cerebral palsy (HCC)    Cysts of right lower eyelid 02/16/2017   Added automatically from request for surgery [id number]     Added automatically from request for surgery [id number]   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)    Neuromuscular disorder (HCC)    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 No pertinent surgical history. Patient Active Problem List   Diagnosis Date Noted   Gastroesophageal reflux disease without esophagitis 03/02/2024   Tinnitus of both ears 03/02/2024   Enlarged thyroid  gland 03/02/2024   Acute non-recurrent pansinusitis 08/02/2023   Seasonal allergic rhinitis 08/02/2023   Anxiety and depression 04/08/2023   Encounter for therapeutic drug monitoring 10/17/2018   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) 06/23/2018   Cerebral palsy (HCC) 06/23/2018   Seizure disorder (HCC) 01/09/2018    ONSET DATE: ~Gradually over the past 2 years  REFERRING DIAG: R29.898 (ICD-10-CM) - Leg weakness   THERAPY DIAG:  Unsteadiness on feet  Generalized weakness  Difficulty in walking  Abnormality of gait  Rationale for Evaluation and Treatment: Rehabilitation  SUBJECTIVE:                                                                                                                                                                                              SUBJECTIVE STATEMENT:   Mother reports everything has been going well with the trekking pole. No falls.    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 Pt's mother noticed pt's strength and balance has declined gradually over the past 2 years. Patient's mother reports pt has R inattention/neglect. Pt's mother reports pt has R hand contracture and requests  pt receive OT to address this. Pt's mother reports previously pt could walk independently, although slow with a wide gait and excessive forward trunk flexed posture, but now he is unable to walk without constant L HHA. Pt's mother reports pt previously had a posterior Dmya Long, but he fell forward out of it. Mother reports pt also tried a RW and the same thing happened with pt having anterior LOB pushing AD too far out in front of himself (she states same thing happens if patient tries to walk with a buggy in the store). Pt's mother reports when pt navigating steps she provides B HHA rather than him using HRs; otherwise, he will bump up/down the stairs on his bottom.Patient stating he wants to be able to walk without needing HHA from his mother. 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 No  PRECAUTIONS: Fall and Other: hx of seizures (chronic and stable)  WEIGHT BEARING RESTRICTIONS: No  FALLS: Has patient fallen in last 6 months? Yes. Number of falls mother reports pt has frequent falls when he gets up in the middle of the night to go to bathroom by himself ~1x every 2 weeks  LIVING ENVIRONMENT: Lives with: lives with their family and mother and father Lives in: House/apartment Stairs: Yes: Internal: 5 steps; bilateral but  cannot reach both and External: 2 steps; using B HHA from pt's mother Has following equipment at home: shower chair CLOF: Patient scoots on his buttocks to navigate indoor stairs because the handrails are not close enough to reach. Patient using B HHA from his mother to navigate external stairs. Pt requires constant L HHA from his mother to ambulate (otherwise according to pt's mother, he is flexed forward so much that he is almost in quadruped). Mother reports pt can perform stand pivot transfers from EOB to gaming chair mod-I to wheel around in his room in the chair.   PLOF: Independent with household mobility without device, Independent with gait, Independent with transfers, and Needs assistance with ADLs  PATIENT GOALS: walk independently without always needing HHA from his mother, get his R leg as strong his L leg  OBJECTIVE:  Note: Objective measures were completed at evaluation unless otherwise noted.                                                                                                                           TREATMENT DATE: 04/11/2024   -369ft AMB overground c LUE trekking pole ad lib (minGuard assist due to full body startle response with LOB) *needs full navigation cues due to spatial disorientation   Basketball toss x 25 balls with CGA for safety - patient with very wide BOS. Cues for shoulder width apart, however patient not correcting due to preference  Activity Description: 3 blaze pods placed on mirror arms length away above his head to encourage upward and forward gaze  Activity Setting:  The  Blaze Pod Random setting was chosen to enhance cognitive processing and agility, providing an unpredictable environment to simulate real-world scenarios, and fostering quick reactions and adaptability.  Number of Pods:  3 Cycles/Sets:  1 Duration (Time or Hit Count):  2.5 minutes   Sit to stand while holding 9-10lb medicine ball x 10 with cues for fully upright standing.     PATIENT EDUCATION: Education details: PT POC, findings on assessment, HEP Person educated: Patient and Mother Education method: Explanation and Handouts Education comprehension: verbalized understanding and needs further education  HOME EXERCISE PROGRAM: Access Code: [email or web address] URL: [email or web address] Date: 03/14/2024 Prepared by: Connell Kiss  Exercises - Step Taps with Counter Support  - 1 x daily - 7 x weekly - 2 sets - 10 reps  GOALS: Goals reviewed with patient? Yes  SHORT TERM GOALS: Target date: 04/25/2024  Patient will be independent with home exercise program to improve strength/mobility for increased functional independence with ADLs and mobility.  Baseline: initiated on 7/22 and pt will require assistance from his mother Goal status: INITIAL   LONG TERM GOALS: Target date: 06/06/2024  Patient will increase Berg Balance score to > 51/56 to demonstrate improved balance and decreased fall risk during functional activities and ADLs.  Baseline: 03/30/2024: 17/56 Goal status: INITIAL  2.  Patient will increase six minute walk test distance to >1051ft, using LRAD with no more than SBA, for progression to more independent community level ambulation, demonstrating improved gait endurance. Baseline: 03/30/2024: 653 feet (199 meters, Avg speed 0.55 m/s) using L HHA providing min A. Goal status: INITIAL  3.  Patient will increase 10 meter walk test to >1.36m/s using LRAD mod-I as to improve gait speed for better community ambulation and to reduce fall risk. Baseline: 0.15m/s using L HHA for min A  Goal status: INITIAL  4.  Patient will improve Stroke Impact Scale-16 by >9 points indicating patient experiencing increased independence with functional mobility tasks and ADLs to improve quality of life and decrease caregiver burden. (Questionnaire to be completed by his mother) Baseline: 39/80 Goal status: INITIAL  5.  Patient will be modified independent with  household level ambulation using LRAD to improve QOL, decrease fall risk, and promote overall increased independence in the home.  Baseline: currently requires constant L HHA to ambulate Goal status: INITIAL  6.  Patient will be able to ascend/descend 4 steps using 1 HR support and no more than SBA to increase his independence with home entry.  Baseline: min A and B HR or B HHA support  Goal status: INITIAL  ASSESSMENT:  CLINICAL IMPRESSION:   Continued focus on ambulation with trekking pole in L UE, however patient not responsive to cueing for upright gaze or narrowing BOS as patient prefers this positioning to reduce risk for fall. Educated patient on increasing fall risk with downward gaze and wide BOS as it makes it difficult to correct LOB. Also, patient participated in blaze pod activity focused on upward gaze with good tolerance (reaching with RUE throughout). Patient will benefit from further skilled PT to improve these deficits in order to increase QOL, decrease fall risk, and ease/safety with ADLs while decreasing caregiver burden.    OBJECTIVE IMPAIRMENTS: Abnormal gait, decreased activity tolerance, decreased balance, decreased cognition, decreased coordination, decreased endurance, decreased knowledge of use of DME, decreased mobility, difficulty walking, decreased strength, and impaired vision/preception.   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   REHAB POTENTIAL:  Good  CLINICAL DECISION MAKING: Evolving/moderate complexity  EVALUATION COMPLEXITY: Moderate  PLAN:  PT FREQUENCY: 1-2x/week  PT DURATION: 12 weeks  PLANNED INTERVENTIONS: 97164- PT Re-evaluation, 97750- Physical Performance Testing, 97110-Therapeutic exercises, 97530- Therapeutic activity, [id number]- Neuromuscular re-education, 97535- Self Care, 02859- Manual therapy, [id number]- Gait training, [phone]- Orthotic Initial, [phone]- Orthotic/Prosthetic subsequent, [phone]- Canalith repositioning, [phone]- Electrical stimulation (manual), Patient/Family education, Balance training, Stair training, Taping, Joint mobilization, Spinal mobilization, Vestibular training, Visual/preceptual remediation/compensation, Cognitive remediation, DME instructions, Cryotherapy, Moist heat, and Biofeedback  PLAN FOR NEXT SESSION:  - progress HEP  - standing balance  - progression towards more normal BOS (stands with wide base - especially when nervous)   - use of external targets to keep feet in closer BOS  - reaching and head turning in standing (Blaze Pods) - gait training with decreased reliance on L HHA  - continue use of walking stick - try new litegait when it arrives? - stair navigation training with use of walking stick vs 1 HR to simulate home set-up  2:10 PM, 04/11/24 Maryanne Finder, PT, DPT Physical Therapist - [place]  [facility]

## 2024-04-11 ENCOUNTER — Ambulatory Visit

## 2024-04-11 ENCOUNTER — Encounter: Admitting: Occupational Therapy

## 2024-04-11 DIAGNOSIS — R2681 Unsteadiness on feet: Secondary | ICD-10-CM | POA: Diagnosis not present

## 2024-04-11 DIAGNOSIS — R262 Difficulty in walking, not elsewhere classified: Secondary | ICD-10-CM

## 2024-04-11 DIAGNOSIS — R269 Unspecified abnormalities of gait and mobility: Secondary | ICD-10-CM

## 2024-04-11 DIAGNOSIS — R531 Weakness: Secondary | ICD-10-CM

## 2024-04-13 ENCOUNTER — Ambulatory Visit: Admitting: Physical Therapy

## 2024-04-13 DIAGNOSIS — R2681 Unsteadiness on feet: Secondary | ICD-10-CM | POA: Diagnosis not present

## 2024-04-13 DIAGNOSIS — R531 Weakness: Secondary | ICD-10-CM

## 2024-04-13 DIAGNOSIS — R269 Unspecified abnormalities of gait and mobility: Secondary | ICD-10-CM

## 2024-04-13 DIAGNOSIS — R262 Difficulty in walking, not elsewhere classified: Secondary | ICD-10-CM

## 2024-04-13 NOTE — Therapy (Signed)
 OUTPATIENT PHYSICAL THERAPY TREATMENT  Patient Name: Tony Harper MRN: [id number] DOB:06/28/1996, 28 y.o., male Today's Date: 04/13/2024  PCP: Dineen Channel, PA-C REFERRING PROVIDER: Lane Arthea BRAVO, MD   END OF SESSION:   PT End of Session - 04/13/24 1609     Visit Number 6    Number of Visits 24    Date for PT Re-Evaluation 06/06/24    Progress Note Due on Visit 10    PT Start Time 1615    PT Stop Time 1656    PT Time Calculation (min) 41 min    Equipment Utilized During Treatment Gait belt    Activity Tolerance Patient tolerated treatment well    Behavior During Therapy WFL for tasks assessed/performed             Past Medical History:  Diagnosis Date   Cerebral palsy (HCC)    Cysts of right lower eyelid 02/16/2017   Added automatically from request for surgery [id number]     Added automatically from request for surgery [id number]   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)    Neuromuscular disorder (HCC)    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 Patient Active Problem List   Diagnosis Date Noted   Gastroesophageal reflux disease without esophagitis 03/02/2024   Tinnitus of both ears 03/02/2024   Enlarged thyroid  gland 03/02/2024   Acute non-recurrent pansinusitis 08/02/2023   Seasonal allergic rhinitis 08/02/2023   Anxiety and depression 04/08/2023   Encounter for therapeutic drug monitoring 10/17/2018   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) 06/23/2018   Cerebral palsy (HCC) 06/23/2018   Seizure disorder (HCC) 01/09/2018    ONSET DATE: ~Gradually over the past 2 years  REFERRING DIAG: R29.898 (ICD-10-CM) - Leg weakness   THERAPY DIAG:  Unsteadiness on feet  Generalized weakness  Difficulty in walking  Abnormality of gait  Rationale for Evaluation and Treatment: Rehabilitation  SUBJECTIVE:                                                                                                                                                                                              SUBJECTIVE STATEMENT:   Father reports everything has been going well with the trekking pole. No falls.    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 Pt's mother noticed pt's strength and balance has declined gradually over the past 2 years. Patient's mother reports pt has R inattention/neglect. Pt's  mother reports pt has R hand contracture and requests pt receive OT to address this. Pt's mother reports previously pt could walk independently, although slow with a wide gait and excessive forward trunk flexed posture, but now he is unable to walk without constant L HHA. Pt's mother reports pt previously had a posterior walker, but he fell forward out of it. Mother reports pt also tried a RW and the same thing happened with pt having anterior LOB pushing AD too far out in front of himself (she states same thing happens if patient tries to walk with a buggy in the store). Pt's mother reports when pt navigating steps she provides B HHA rather than him using HRs; otherwise, he will bump up/down the stairs on his bottom.Patient stating he wants to be able to walk without needing HHA from his mother. 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 No  PRECAUTIONS: Fall and Other: hx of seizures (chronic and stable)  WEIGHT BEARING RESTRICTIONS: No  FALLS: Has patient fallen in last 6 months? Yes. Number of falls mother reports pt has frequent falls when he gets up in the middle of the night to go to bathroom by himself ~1x every 2 weeks  LIVING ENVIRONMENT: Lives with: lives with their family and mother and father Lives in: House/apartment Stairs: Yes: Internal: 5 steps;  bilateral but cannot reach both and External: 2 steps; using B HHA from pt's mother Has following equipment at home: shower chair CLOF: Patient scoots on his buttocks to navigate indoor stairs because the handrails are not close enough to reach. Patient using B HHA from his mother to navigate external stairs. Pt requires constant L HHA from his mother to ambulate (otherwise according to pt's mother, he is flexed forward so much that he is almost in quadruped). Mother reports pt can perform stand pivot transfers from EOB to gaming chair mod-I to wheel around in his room in the chair.   PLOF: Independent with household mobility without device, Independent with gait, Independent with transfers, and Needs assistance with ADLs  PATIENT GOALS: walk independently without always needing HHA from his mother, get his R leg as strong his L leg  OBJECTIVE:  Note: Objective measures were completed at evaluation unless otherwise noted.                                                                                                                           TREATMENT DATE: 04/13/2024    TA- To improve functional movements patterns for everyday tasks   362ft AMB overground c LUE trekking pole ad lib (minGuard assist due to full body startle response with LOB) *needs full navigation cues due to spatial disorientation  STS x 10  Fire alarm for drill goes off and have to move to private room setting to avoid flashing lights ( potential cause for seizure)   Sidestepping with treking pole  2 x 27ft ea direction   STS x 10 UE on treking pole   Back to clinic since fire alarm stopped flashing.   Basketball toss x 35 balls with CGA for safety - patient with very wide BOS. , has to perform squats to pick up some balls form floor he wants to rebound but otherwise has to reach to edge of BOS to get balls from basket.   Attempted side stepping over foam rollers to engage more morrow BOS but pt just did it forward  despite multimodal cueing, reports sidestepping would be boring x 3 rounds   Med ball slam x 10 reps, PT has to push med ball to pt feet on some reps ( 9-10 kg)      PATIENT EDUCATION: Education details: PT POC, findings on assessment, HEP Person educated: Patient and Mother Education method: Explanation and Handouts Education comprehension: verbalized understanding and needs further education  HOME EXERCISE PROGRAM: Access Code: [email or web address] URL: [email or web address] Date: 03/14/2024 Prepared by: Connell Kiss  Exercises - Step Taps with Counter Support  - 1 x daily - 7 x weekly - 2 sets - 10 reps  GOALS: Goals reviewed with patient? Yes  SHORT TERM GOALS: Target date: 04/25/2024  Patient will be independent with home exercise program to improve strength/mobility for increased functional independence with ADLs and mobility.  Baseline: initiated on 7/22 and pt will require assistance from his mother Goal status: INITIAL   LONG TERM GOALS: Target date: 06/06/2024  Patient will increase Berg Balance score to > 51/56 to demonstrate improved balance and decreased fall risk during functional activities and ADLs.  Baseline: 03/30/2024: 17/56 Goal status: INITIAL  2.  Patient will increase six minute walk test distance to >1061ft, using LRAD with no more than SBA, for progression to more independent community level ambulation, demonstrating improved gait endurance. Baseline: 03/30/2024: 653 feet (199 meters, Avg speed 0.55 m/s) using L HHA providing min A. Goal status: INITIAL  3.  Patient will increase 10 meter walk test to >1.80m/s using LRAD mod-I as to improve gait speed for better community ambulation and to reduce fall risk. Baseline: 0.55m/s using L HHA for min A  Goal status: INITIAL  4.  Patient will improve Stroke Impact Scale-16 by >9 points indicating patient experiencing increased independence with functional mobility tasks and ADLs to improve quality of life and  decrease caregiver burden. (Questionnaire to be completed by his mother) Baseline: 39/80 Goal status: INITIAL  5.  Patient will be modified independent with household level ambulation using LRAD to improve QOL, decrease fall risk, and promote overall increased independence in the home.  Baseline: currently requires constant L HHA to ambulate Goal status: INITIAL  6.  Patient will be able to ascend/descend 4 steps using 1 HR support and no more than SBA to increase his independence with home entry.  Baseline: min A and B HR or B HHA support  Goal status: INITIAL  ASSESSMENT:  CLINICAL IMPRESSION:   Continued focus on ambulation with trekking pole in L UE, pt getting very accustomed to using this and prefers it compared to support rails despite increased stability of rails. PT very hesitant to complete activities involving lateral stepping with more NBOS despite cues and instructions. Pt did well with basketball shot activity and stayed in prolonged standing without support for prolonged time with this although it was in wide BOS stance throughout.  Patient will benefit from further skilled PT to improve these deficits in order to increase QOL, decrease  fall risk, and ease/safety with ADLs while decreasing caregiver burden.    OBJECTIVE IMPAIRMENTS: Abnormal gait, decreased activity tolerance, decreased balance, decreased cognition, decreased coordination, decreased endurance, decreased knowledge of use of DME, decreased mobility, difficulty walking, decreased strength, and impaired vision/preception.   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   REHAB POTENTIAL: Good  CLINICAL DECISION MAKING: Evolving/moderate complexity  EVALUATION COMPLEXITY: Moderate  PLAN:  PT FREQUENCY: 1-2x/week  PT DURATION: 12 weeks  PLANNED INTERVENTIONS: 97164- PT Re-evaluation, 97750- Physical Performance Testing, 97110-Therapeutic exercises, 97530- Therapeutic activity, [id number]- Neuromuscular re-education, 97535- Self Care, [id number]- Manual therapy, [id number]- Gait training, [phone]- Orthotic Initial, [phone]- Orthotic/Prosthetic subsequent, [phone]- Canalith repositioning, [phone]- Electrical stimulation (manual), Patient/Family education, Balance training, Stair training, Taping, Joint mobilization, Spinal mobilization, Vestibular training, Visual/preceptual remediation/compensation, Cognitive remediation, DME instructions, Cryotherapy, Moist heat, and Biofeedback  PLAN FOR NEXT SESSION:  - progress HEP  - standing balance  - progression towards more normal BOS (stands with wide base - especially when nervous)   - use of external targets to keep feet in closer BOS  - reaching and head turning in standing (Blaze Pods) - gait training with decreased reliance on L HHA  - continue use of walking stick - try new litegait when it arrives? - stair navigation training with use of walking stick vs 1 HR to simulate home set-up  4:11 PM, 04/13/24 Note: Portions of this document were prepared using Dragon voice recognition software and although reviewed may contain unintentional dictation errors in syntax, grammar, or spelling.  Lonni KATHEE Gainer PT ,DPT Physical Therapist- [place]  [facility]

## 2024-04-18 ENCOUNTER — Ambulatory Visit

## 2024-04-18 DIAGNOSIS — R269 Unspecified abnormalities of gait and mobility: Secondary | ICD-10-CM

## 2024-04-18 DIAGNOSIS — R2681 Unsteadiness on feet: Secondary | ICD-10-CM | POA: Diagnosis not present

## 2024-04-18 DIAGNOSIS — R531 Weakness: Secondary | ICD-10-CM

## 2024-04-18 DIAGNOSIS — R262 Difficulty in walking, not elsewhere classified: Secondary | ICD-10-CM

## 2024-04-18 NOTE — Therapy (Signed)
 OUTPATIENT PHYSICAL THERAPY TREATMENT  Patient Name: Tony Harper MRN: [id number] DOB:1996/08/06, 28 y.o., male Today's Date: 04/18/2024  PCP: Ostwalt, Janna, PA-C REFERRING PROVIDER: Lane Arthea BRAVO, MD   END OF SESSION:   PT End of Session - 04/18/24 1453     Visit Number 7    Number of Visits 24    Date for PT Re-Evaluation 06/06/24    Progress Note Due on Visit 10    PT Start Time 1450    PT Stop Time 1530    PT Time Calculation (min) 40 min    Equipment Utilized During Treatment Gait belt    Activity Tolerance Patient tolerated treatment well    Behavior During Therapy WFL for tasks assessed/performed           Past Medical History:  Diagnosis Date   Cerebral palsy (HCC)    Cysts of right lower eyelid 02/16/2017   Added automatically from request for surgery [id number]     Added automatically from request for surgery [id number]   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)    Neuromuscular disorder (HCC)    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 No pertinent surgical history. Patient Active Problem List   Diagnosis Date Noted   Gastroesophageal reflux disease without esophagitis 03/02/2024   Tinnitus of both ears 03/02/2024   Enlarged thyroid  gland 03/02/2024   Acute non-recurrent pansinusitis 08/02/2023   Seasonal allergic rhinitis 08/02/2023   Anxiety and depression 04/08/2023   Encounter for therapeutic drug monitoring 10/17/2018   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) 06/23/2018   Cerebral palsy (HCC) 06/23/2018   Seizure disorder (HCC) 01/09/2018    ONSET DATE: ~Gradually over the past 2 years  REFERRING DIAG: R29.898 (ICD-10-CM) - Leg weakness   THERAPY DIAG:  Unsteadiness on feet  Generalized weakness  Difficulty in walking  Abnormality of gait  Rationale for Evaluation and Treatment: Rehabilitation  SUBJECTIVE:                                                                                                                                                                                              SUBJECTIVE STATEMENT:   Pt reports no complications upon arrival to the clinic.  Pt states no falls since the last visit, but if he had, he would have just gotten up.   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 Pt's mother noticed pt's strength and balance has declined gradually over  the past 2 years. Patient's mother reports pt has R inattention/neglect. Pt's mother reports pt has R hand contracture and requests pt receive OT to address this. Pt's mother reports previously pt could walk independently, although slow with a wide gait and excessive forward trunk flexed posture, but now he is unable to walk without constant L HHA. Pt's mother reports pt previously had a posterior walker, but he fell forward out of it. Mother reports pt also tried a RW and the same thing happened with pt having anterior LOB pushing AD too far out in front of himself (she states same thing happens if patient tries to walk with a buggy in the store). Pt's mother reports when pt navigating steps she provides B HHA rather than him using HRs; otherwise, he will bump up/down the stairs on his bottom.Patient stating he wants to be able to walk without needing HHA from his mother. 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 No  PRECAUTIONS: Fall and Other: hx of seizures (chronic and stable)  WEIGHT BEARING RESTRICTIONS: No  FALLS: Has patient fallen in last 6 months? Yes. Number of falls mother reports pt has frequent falls when he gets up in the middle of the night to go to bathroom by himself ~1x every 2 weeks  LIVING ENVIRONMENT: Lives with: lives with their family and  mother and father Lives in: House/apartment Stairs: Yes: Internal: 5 steps; bilateral but cannot reach both and External: 2 steps; using B HHA from pt's mother Has following equipment at home: shower chair CLOF: Patient scoots on his buttocks to navigate indoor stairs because the handrails are not close enough to reach. Patient using B HHA from his mother to navigate external stairs. Pt requires constant L HHA from his mother to ambulate (otherwise according to pt's mother, he is flexed forward so much that he is almost in quadruped). Mother reports pt can perform stand pivot transfers from EOB to gaming chair mod-I to wheel around in his room in the chair.   PLOF: Independent with household mobility without device, Independent with gait, Independent with transfers, and Needs assistance with ADLs  PATIENT GOALS: walk independently without always needing HHA from his mother, get his R leg as strong his L leg  OBJECTIVE:  Note: Objective measures were completed at evaluation unless otherwise noted.                                                                                                                           TREATMENT DATE: 04/18/2024    TA- To improve functional movements patterns for everyday tasks   150' ambulation overground with use of small basketballs in hands to assist with tactile feedback during ambulation around the gym, noted one instance of startle reflex after pt kicked therapist's shoe from widened base of support  Ambulation within speed ladder to force narrowed gait, however pt refusing on  multiple attempts and instead either performing step-to gait pattern, or rotating hips to be able to perform the narrowed gait, x8 total attempts  STS with feet placed inside of the underside of a 6 step to prevent the pt from widening his support 2x10  Basketball toss x 35 balls with CGA for safety - patient with very wide BOS. , has to perform squats to pick up some balls form  floor he wants to rebound but otherwise has to reach to edge of BOS to get balls from basket.   STS with 9-10# med ball, x10 STS with 9-10# med ball and going into forward flexion to promote upright posture, x10     PATIENT EDUCATION: Education details: PT POC, findings on assessment, HEP Person educated: Patient and Mother Education method: Explanation and Handouts Education comprehension: verbalized understanding and needs further education  HOME EXERCISE PROGRAM: Access Code: [email or web address] URL: [email or web address] Date: 03/14/2024 Prepared by: Connell Kiss  Exercises - Step Taps with Counter Support  - 1 x daily - 7 x weekly - 2 sets - 10 reps  GOALS: Goals reviewed with patient? Yes  SHORT TERM GOALS: Target date: 04/25/2024  Patient will be independent with home exercise program to improve strength/mobility for increased functional independence with ADLs and mobility.  Baseline: initiated on 7/22 and pt will require assistance from his mother Goal status: INITIAL   LONG TERM GOALS: Target date: 06/06/2024  Patient will increase Berg Balance score to > 51/56 to demonstrate improved balance and decreased fall risk during functional activities and ADLs.  Baseline: 03/30/2024: 17/56 Goal status: INITIAL  2.  Patient will increase six minute walk test distance to >1027ft, using LRAD with no more than SBA, for progression to more independent community level ambulation, demonstrating improved gait endurance. Baseline: 03/30/2024: 653 feet (199 meters, Avg speed 0.55 m/s) using L HHA providing min A. Goal status: INITIAL  3.  Patient will increase 10 meter walk test to >1.67m/s using LRAD mod-I as to improve gait speed for better community ambulation and to reduce fall risk. Baseline: 0.32m/s using L HHA for min A  Goal status: INITIAL  4.  Patient will improve Stroke Impact Scale-16 by >9 points indicating patient experiencing increased independence with functional  mobility tasks and ADLs to improve quality of life and decrease caregiver burden. (Questionnaire to be completed by his mother) Baseline: 39/80 Goal status: INITIAL  5.  Patient will be modified independent with household level ambulation using LRAD to improve QOL, decrease fall risk, and promote overall increased independence in the home.  Baseline: currently requires constant L HHA to ambulate Goal status: INITIAL  6.  Patient will be able to ascend/descend 4 steps using 1 HR support and no more than SBA to increase his independence with home entry.  Baseline: min A and B HR or B HHA support  Goal status: INITIAL  ASSESSMENT:  CLINICAL IMPRESSION:    Pt continues to have increased difficulty with the widened BOS when performing exercises.  Even with multimodal cuing, pt still has consistently widened support that causes him to step on therapist's foot or hit things outside of his cone of support and cause a startle reflex.  Pt ultimately forced to keep narrowed BOS when feet are placed within underside of 6' step box and performed well with the task, although he voiced displeasure due to it being more difficult.   Pt will continue to benefit from skilled therapy to address remaining deficits in order to improve overall QoL  and return to PLOF.     OBJECTIVE IMPAIRMENTS: Abnormal gait, decreased activity tolerance, decreased balance, decreased cognition, decreased coordination, decreased endurance, decreased knowledge of use of DME, decreased mobility, difficulty walking, decreased strength, and impaired vision/preception.   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   REHAB POTENTIAL: Good  CLINICAL DECISION MAKING: Evolving/moderate complexity  EVALUATION COMPLEXITY: Moderate  PLAN:  PT FREQUENCY: 1-2x/week  PT DURATION: 12 weeks  PLANNED INTERVENTIONS: 97164- PT Re-evaluation, 97750- Physical Performance Testing, 97110-Therapeutic exercises, 97530- Therapeutic activity, [id number]- Neuromuscular re-education, 97535- Self Care, 02859- Manual therapy, [id number]- Gait training, [phone]- Orthotic Initial, [phone]- Orthotic/Prosthetic subsequent, [phone]- Canalith repositioning, [phone]- Electrical stimulation (manual), Patient/Family education, Balance training, Stair training, Taping, Joint mobilization, Spinal mobilization, Vestibular training, Visual/preceptual remediation/compensation, Cognitive remediation, DME instructions, Cryotherapy, Moist heat, and Biofeedback  PLAN FOR NEXT SESSION:  - progress HEP  - standing balance  - progression towards more normal BOS (stands with wide base - especially when nervous)   - use of external targets to keep feet in closer BOS  - reaching and head turning in standing (Blaze Pods) - gait training with decreased reliance on L HHA  - continue use of walking stick - try new litegait when it arrives? - stair navigation training with use of walking stick vs 1 HR to simulate home set-up    Fonda Simpers, PT, DPT Physical Therapist - [facility]  04/18/24, 6:03 PM

## 2024-04-20 ENCOUNTER — Ambulatory Visit: Admitting: Physical Therapy

## 2024-04-20 DIAGNOSIS — R2681 Unsteadiness on feet: Secondary | ICD-10-CM | POA: Diagnosis not present

## 2024-04-20 DIAGNOSIS — R262 Difficulty in walking, not elsewhere classified: Secondary | ICD-10-CM

## 2024-04-20 DIAGNOSIS — R269 Unspecified abnormalities of gait and mobility: Secondary | ICD-10-CM

## 2024-04-20 DIAGNOSIS — R531 Weakness: Secondary | ICD-10-CM

## 2024-04-20 NOTE — Therapy (Signed)
 OUTPATIENT PHYSICAL THERAPY TREATMENT  Patient Name: Tony Harper MRN: [id number] DOB:[birth year], 28 y.o., male Today's Date: 04/20/2024  PCP: Ostwalt, Janna, PA-C REFERRING PROVIDER: Lane Arthea BRAVO, MD   END OF SESSION:   PT End of Session - 04/20/24 1534     Visit Number 8    Number of Visits 24    Date for PT Re-Evaluation 06/06/24    Progress Note Due on Visit 10    PT Start Time 1535    PT Stop Time 1622    PT Time Calculation (min) 47 min    Equipment Utilized During Treatment Gait belt    Activity Tolerance Patient tolerated treatment well    Behavior During Therapy WFL for tasks assessed/performed            Past Medical History:  Diagnosis Date   Cerebral palsy (HCC)    Cysts of right lower eyelid 02/16/2017   Added automatically from request for surgery [id number]     Added automatically from request for surgery [id number]   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)    Neuromuscular disorder (HCC)    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 Patient Active Problem List   Diagnosis Date Noted   Gastroesophageal reflux disease without esophagitis 03/02/2024   Tinnitus of both ears 03/02/2024   Enlarged thyroid  gland 03/02/2024   Acute non-recurrent pansinusitis 08/02/2023   Seasonal allergic rhinitis 08/02/2023   Anxiety and depression 04/08/2023   Encounter for therapeutic drug monitoring 10/17/2018   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) 06/23/2018   Cerebral palsy (HCC) 06/23/2018   Seizure disorder (HCC) 01/09/2018    ONSET DATE: ~Gradually over the past 2 years  REFERRING DIAG: R29.898 (ICD-10-CM) - Leg weakness   THERAPY DIAG:  Unsteadiness on feet  Generalized weakness  Difficulty in walking  Abnormality of gait  Rationale for Evaluation and Treatment: Rehabilitation  SUBJECTIVE:                                                                                                                                                                                              SUBJECTIVE STATEMENT:    Pt reports he has been so, so. Patient ambulates into clinic using his walking stick in L UE, but still requests R HHA for additional support. No reports of falls since last visit.    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 Pt's mother noticed  pt's strength and balance has declined gradually over the past 2 years. Patient's mother reports pt has R inattention/neglect. Pt's mother reports pt has R hand contracture and requests pt receive OT to address this. Pt's mother reports previously pt could walk independently, although slow with a wide gait and excessive forward trunk flexed posture, but now he is unable to walk without constant L HHA. Pt's mother reports pt previously had a posterior walker, but he fell forward out of it. Mother reports pt also tried a RW and the same thing happened with pt having anterior LOB pushing AD too far out in front of himself (she states same thing happens if patient tries to walk with a buggy in the store). Pt's mother reports when pt navigating steps she provides B HHA rather than him using HRs; otherwise, he will bump up/down the stairs on his bottom.Patient stating he wants to be able to walk without needing HHA from his mother. 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 No  PRECAUTIONS: Fall and Other: hx of seizures (chronic and stable)  WEIGHT BEARING RESTRICTIONS: No  FALLS: Has patient fallen in last 6 months? Yes. Number of falls mother reports pt has frequent falls when he gets up in the middle of the night to go to bathroom by himself ~1x every 2 weeks  LIVING ENVIRONMENT: Lives with:  lives with their family and mother and father Lives in: House/apartment Stairs: Yes: Internal: 5 steps; bilateral but cannot reach both and External: 2 steps; using B HHA from pt's mother Has following equipment at home: shower chair CLOF: Patient scoots on his buttocks to navigate indoor stairs because the handrails are not close enough to reach. Patient using B HHA from his mother to navigate external stairs. Pt requires constant L HHA from his mother to ambulate (otherwise according to pt's mother, he is flexed forward so much that he is almost in quadruped). Mother reports pt can perform stand pivot transfers from EOB to gaming chair mod-I to wheel around in his room in the chair.   PLOF: Independent with household mobility without device, Independent with gait, Independent with transfers, and Needs assistance with ADLs  PATIENT GOALS: walk independently without always needing HHA from his mother, get his R leg as strong his L leg  OBJECTIVE:  Note: Objective measures were completed at evaluation unless otherwise noted.                                                                                                                           TREATMENT DATE: 04/20/2024    Patient ambulated into therapy clinic using walking stick in L UE and R HHA from therapist providing light min A for support.   Unless otherwise stated, CGA/light min A was provided and gait belt donned in order to ensure pt safety throughout session.  Gait training ~112ft using L UE support  on walking stick and no R UE support - CGA/light min A for steadying - pt conitnues to have intermittent startle reflex and feels unsure of his balance when navigating through doorways resulting in wider BOS - continues to demo decreased gait speed and overall wider BOS with decreased step lengths.   Gait training ~123ft holding a cone in each hand to act as sensory feedback with CGA/light min A for balance - has wider BOS, kicking  therapist's foot 2x during with his R LE due to further decreased confidence in his balance when not having UE support - slower gait speed compared to with walking stick.  Stair navigation training ascending/descending 4 steps x2 (6 height) using L UE support on each HR while carrying walking stick in R UE, requires light min assist for balance - reciprocal pattern on ascent and step-to pattern on descent. Patient initially resistant to attempting stair navigation in this way due to him reporting he is comfortable with bumping up/down stairs on his bottom like he has done since he was young.    Dynamic gait training using agility ladder targeting more narrow BOS and increased step lengths via forward reciprocal stepping pattern - 4 laps - progressed from up to heavy mod A to min A for balance throughout as pt's confidence and familiarity with the task improved. He is never able to fully achieve reciprocal stepping pattern with placing his feet in each square, but does improve in more normal BOS and loner step lengths with more natural wt shifting onto stance limbs.  Dynamic balance progressed to dynamic gait training while performing dual-task of using boxing gloves to punch external target from +2 A while therapist provided light min A for balance - performed for ~68ft, which provided pt with self-perturbations and dynamic, quick reaching challenge during standing and gait. This also encouraged NMR of R UE via coordinated movement with increased force towards external target. However, patient did not enjoy wearing the boxing gloves, may have been over stimulating to him, and quickly had to remove them at end of task.   Gait training ~66ft out of therapy clinic without UE support and light min A for balance - continues to have wider BOS, but pt now able to maintain a more continuous forward gait pattern with improving confidence and decreased instances of startle reflex. However, patient really unhappy  with therapist encouraging him to participate in gait training without his walking stick.   Throughout session, patient very perseverative on wanting to use his walking stick for all standing/ambulatory tasks and resistant to thearpist encouraging him to participate in tasks without it.     PATIENT EDUCATION: Education details: PT POC, findings on assessment, HEP Person educated: Patient and Mother Education method: Explanation and Handouts Education comprehension: verbalized understanding and needs further education  HOME EXERCISE PROGRAM: Access Code: [email or web address] URL: [email or web address] Date: 03/14/2024 Prepared by: Connell Kiss  Exercises - Step Taps with Counter Support  - 1 x daily - 7 x weekly - 2 sets - 10 reps  GOALS: Goals reviewed with patient? Yes  SHORT TERM GOALS: Target date: 04/25/2024  Patient will be independent with home exercise program to improve strength/mobility for increased functional independence with ADLs and mobility.  Baseline: initiated on 7/22 and pt will require assistance from his mother Goal status: INITIAL   LONG TERM GOALS: Target date: 06/06/2024  Patient will increase Berg Balance score to > 51/56 to demonstrate improved balance and decreased fall risk during functional activities and ADLs.  Baseline: 03/30/2024: 17/56 Goal status: INITIAL  2.  Patient will increase six minute walk test distance to >1066ft, using LRAD with no more than SBA, for progression to more independent community level ambulation, demonstrating improved gait endurance. Baseline: 03/30/2024: 653 feet (199 meters, Avg speed 0.55 m/s) using L HHA providing min A. Goal status: INITIAL  3.  Patient will increase 10 meter walk test to >1.70m/s using LRAD mod-I as to improve gait speed for better community ambulation and to reduce fall risk. Baseline: 0.1m/s using L HHA for min A  Goal status: INITIAL  4.  Patient will improve Stroke Impact Scale-16 by >9 points  indicating patient experiencing increased independence with functional mobility tasks and ADLs to improve quality of life and decrease caregiver burden. (Questionnaire to be completed by his mother) Baseline: 39/80 Goal status: INITIAL  5.  Patient will be modified independent with household level ambulation using LRAD to improve QOL, decrease fall risk, and promote overall increased independence in the home.  Baseline: currently requires constant L HHA to ambulate Goal status: INITIAL  6.  Patient will be able to ascend/descend 4 steps using 1 HR support and no more than SBA to increase his independence with home entry.  Baseline: min A and B HR or B HHA support  Goal status: INITIAL  ASSESSMENT:  CLINICAL IMPRESSION:   Patient arrives motivated to participate in therapy session; however, overall patient has become very perseverative and insistent on his need to have his walking stick for support during all standing/ambulatory tasks, likely due to him starting to have increasing confidence and independence when he is using it. Patient continues to demo overall wider BOS during gait; however, improving with patient having more continuous forward gait pattern with decreased instances of startle reflex. Patient does continue to walk in a high guard posture with downward gaze, but occasionally is able to now look up at environment without pausing his gait. With encouragement, pt agreeable to participate in stair navigation training using 1 HR and walking stick in his hand and pt demos improved upright posture during this compared to prior stair navigation performed with this therapist; however, pt insistent that he prefers to bump up the stairs on his bottom. Pt will continue to benefit from skilled therapy to address remaining deficits in order to improve overall QoL and return to PLOF.     OBJECTIVE IMPAIRMENTS: Abnormal gait, decreased activity tolerance, decreased balance, decreased cognition,  decreased coordination, decreased endurance, decreased knowledge of use of DME, decreased mobility, difficulty walking, decreased strength, and impaired vision/preception.   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   REHAB POTENTIAL: Good  CLINICAL DECISION MAKING: Evolving/moderate complexity  EVALUATION COMPLEXITY: Moderate  PLAN:  PT FREQUENCY: 1-2x/week  PT DURATION: 12 weeks  PLANNED INTERVENTIONS: 97164- PT Re-evaluation, 97750- Physical Performance Testing, 97110-Therapeutic exercises, 97530- Therapeutic activity, [id number]- Neuromuscular re-education, 97535- Self Care, [id number]- Manual therapy, [id number]- Gait training, [phone]- Orthotic Initial, [phone]- Orthotic/Prosthetic subsequent, [phone]- Canalith repositioning, [phone]- Electrical stimulation (manual), Patient/Family education, Balance training, Stair training, Taping, Joint mobilization, Spinal mobilization, Vestibular training, Visual/preceptual remediation/compensation, Cognitive remediation, DME instructions, Cryotherapy, Moist heat, and Biofeedback  PLAN FOR NEXT SESSION:  -  progress HEP  - standing balance  - progression towards more normal BOS (stands with wide base - especially when nervous)   - use of external targets to keep feet in closer BOS (or standing inside brown step that is flipped upside down)  - reaching and head turning in standing ([employer]) - gait training with decreased reliance on L HHA  - continue use  of walking stick (pt has become very reliant on the walking stick) - stair navigation training with use of walking stick and 1 HR to simulate home set-up  - pt wants to continue bumping up on his bottom, but ambulatory stair navigation is improving!    Connell Kiss, PT, DPT, NCS, CSRS Physical Therapist - [place]  [facility]  4:28 PM 04/20/24

## 2024-04-25 ENCOUNTER — Ambulatory Visit: Attending: Neurology | Admitting: Physical Therapy

## 2024-04-25 DIAGNOSIS — R262 Difficulty in walking, not elsewhere classified: Secondary | ICD-10-CM | POA: Insufficient documentation

## 2024-04-25 DIAGNOSIS — R531 Weakness: Secondary | ICD-10-CM | POA: Insufficient documentation

## 2024-04-25 DIAGNOSIS — R2681 Unsteadiness on feet: Secondary | ICD-10-CM | POA: Diagnosis present

## 2024-04-25 DIAGNOSIS — R269 Unspecified abnormalities of gait and mobility: Secondary | ICD-10-CM | POA: Diagnosis present

## 2024-04-25 NOTE — Therapy (Unsigned)
 OUTPATIENT PHYSICAL THERAPY TREATMENT  Patient Name: Tony Harper MRN: [id number] DOB:[birth year], 28 y.o., male Today's Date: 04/25/2024  PCP: Ostwalt, Janna, PA-C REFERRING PROVIDER: Lane Arthea BRAVO, MD   END OF SESSION:   PT End of Session - 04/25/24 1538     Visit Number 9    Number of Visits 24    Date for PT Re-Evaluation 06/06/24    Progress Note Due on Visit 10    PT Start Time 1540    PT Stop Time 1615    PT Time Calculation (min) 35 min    Equipment Utilized During Treatment Gait belt    Activity Tolerance Patient tolerated treatment well    Behavior During Therapy WFL for tasks assessed/performed            Past Medical History:  Diagnosis Date   Cerebral palsy (HCC)    Cysts of right lower eyelid 02/16/2017   Added automatically from request for surgery [id number]     Added automatically from request for surgery [id number]   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)    Neuromuscular disorder (HCC)    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 Patient Active Problem List   Diagnosis Date Noted   Gastroesophageal reflux disease without esophagitis 03/02/2024   Tinnitus of both ears 03/02/2024   Enlarged thyroid  gland 03/02/2024   Acute non-recurrent pansinusitis 08/02/2023   Seasonal allergic rhinitis 08/02/2023   Anxiety and depression 04/08/2023   Encounter for therapeutic drug monitoring 10/17/2018   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) 06/23/2018   Cerebral palsy (HCC) 06/23/2018   Seizure disorder (HCC) 01/09/2018    ONSET DATE: ~Gradually over the past 2 years  REFERRING DIAG: R29.898 (ICD-10-CM) - Leg weakness   THERAPY DIAG:  Unsteadiness on feet  Generalized weakness  Difficulty in walking  Abnormality of gait  Rationale for Evaluation and Treatment: Rehabilitation  SUBJECTIVE:                                                                                                                                                                                              SUBJECTIVE STATEMENT:    Pt reports that he is doing well. Has a little knee pain in BLE, RLE>LLE. Got a new tatoo over the weekend on the RUE. Requesting to not use boxing gloves on this day.    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 Pt's mother  noticed pt's strength and balance has declined gradually over the past 2 years. Patient's mother reports pt has R inattention/neglect. Pt's mother reports pt has R hand contracture and requests pt receive OT to address this. Pt's mother reports previously pt could walk independently, although slow with a wide gait and excessive forward trunk flexed posture, but now he is unable to walk without constant L HHA. Pt's mother reports pt previously had a posterior walker, but he fell forward out of it. Mother reports pt also tried a RW and the same thing happened with pt having anterior LOB pushing AD too far out in front of himself (she states same thing happens if patient tries to walk with a buggy in the store). Pt's mother reports when pt navigating steps she provides B HHA rather than him using HRs; otherwise, he will bump up/down the stairs on his bottom.Patient stating he wants to be able to walk without needing HHA from his mother. 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 No  PRECAUTIONS: Fall and Other: hx of seizures (chronic and stable)  WEIGHT BEARING RESTRICTIONS: No  FALLS: Has patient fallen in last 6 months? Yes. Number of falls mother reports pt has frequent falls when he gets up in the middle of the night to go to bathroom by himself ~1x every 2 weeks  LIVING ENVIRONMENT: Lives with: lives with their  family and mother and father Lives in: House/apartment Stairs: Yes: Internal: 5 steps; bilateral but cannot reach both and External: 2 steps; using B HHA from pt's mother Has following equipment at home: shower chair CLOF: Patient scoots on his buttocks to navigate indoor stairs because the handrails are not close enough to reach. Patient using B HHA from his mother to navigate external stairs. Pt requires constant L HHA from his mother to ambulate (otherwise according to pt's mother, he is flexed forward so much that he is almost in quadruped). Mother reports pt can perform stand pivot transfers from EOB to gaming chair mod-I to wheel around in his room in the chair.   PLOF: Independent with household mobility without device, Independent with gait, Independent with transfers, and Needs assistance with ADLs  PATIENT GOALS: walk independently without always needing HHA from his mother, get his R leg as strong his L leg  OBJECTIVE:  Note: Objective measures were completed at evaluation unless otherwise noted.                                                                                                                           TREATMENT DATE: 04/25/2024    Patient ambulated into therapy clinic with HHA from mother.   Unless otherwise stated, CGA/light min A was provided and gait belt donned in order to ensure pt safety throughout session.  Gait training ~1102ft using L UE support on walking stick and no R UE support - CGA/light min A  for steadying - pt conitnues to have intermittent startle reflex and feels unsure of his balance when navigating through doorways resulting in wider BOS - continues to demo decreased gait speed and overall wider BOS with decreased step lengths.   Forward/reverse with walking stick 48ft x 2   Stair management with BUE support on rail 2 x 4.   Additional gait with     Gait training ~144ft holding a cone in each hand to act as sensory feedback with CGA/light min A  for balance - has wider BOS, kicking therapist's foot 2x during with his R LE due to further decreased confidence in his balance when not having UE support - slower gait speed compared to with walking stick.  Stair navigation training ascending/descending 4 steps x2 (6 height) using L UE support on each HR while carrying walking stick in R UE, requires light min assist for balance - reciprocal pattern on ascent and step-to pattern on descent. Patient initially resistant to attempting stair navigation in this way due to him reporting he is comfortable with bumping up/down stairs on his bottom like he has done since he was young.    Dynamic gait training using agility ladder targeting more narrow BOS and increased step lengths via forward reciprocal stepping pattern - 4 laps - progressed from up to heavy mod A to min A for balance throughout as pt's confidence and familiarity with the task improved. He is never able to fully achieve reciprocal stepping pattern with placing his feet in each square, but does improve in more normal BOS and loner step lengths with more natural wt shifting onto stance limbs.  Dynamic balance progressed to dynamic gait training while performing dual-task of using boxing gloves to punch external target from +2 A while therapist provided light min A for balance - performed for ~55ft, which provided pt with self-perturbations and dynamic, quick reaching challenge during standing and gait. This also encouraged NMR of R UE via coordinated movement with increased force towards external target. However, patient did not enjoy wearing the boxing gloves, may have been over stimulating to him, and quickly had to remove them at end of task.   Gait training ~41ft out of therapy clinic without UE support and light min A for balance - continues to have wider BOS, but pt now able to maintain a more continuous forward gait pattern with improving confidence and decreased instances of startle  reflex. However, patient really unhappy with therapist encouraging him to participate in gait training without his walking stick.   Throughout session, patient very perseverative on wanting to use his walking stick for all standing/ambulatory tasks and resistant to thearpist encouraging him to participate in tasks without it.     PATIENT EDUCATION: Education details: PT POC, findings on assessment, HEP Person educated: Patient and Mother Education method: Explanation and Handouts Education comprehension: verbalized understanding and needs further education  HOME EXERCISE PROGRAM: Access Code: [email or web address] URL: [email or web address] Date: 03/14/2024 Prepared by: Connell Kiss  Exercises - Step Taps with Counter Support  - 1 x daily - 7 x weekly - 2 sets - 10 reps  GOALS: Goals reviewed with patient? Yes  SHORT TERM GOALS: Target date: 04/25/2024  Patient will be independent with home exercise program to improve strength/mobility for increased functional independence with ADLs and mobility.  Baseline: initiated on 7/22 and pt will require assistance from his mother Goal status: INITIAL   LONG TERM GOALS: Target date: 06/06/2024  Patient will increase Berg Balance score to >  51/56 to demonstrate improved balance and decreased fall risk during functional activities and ADLs.  Baseline: 03/30/2024: 17/56 Goal status: INITIAL  2.  Patient will increase six minute walk test distance to >1033ft, using LRAD with no more than SBA, for progression to more independent community level ambulation, demonstrating improved gait endurance. Baseline: 03/30/2024: 653 feet (199 meters, Avg speed 0.55 m/s) using L HHA providing min A. Goal status: INITIAL  3.  Patient will increase 10 meter walk test to >1.69m/s using LRAD mod-I as to improve gait speed for better community ambulation and to reduce fall risk. Baseline: 0.31m/s using L HHA for min A  Goal status: INITIAL  4.  Patient will  improve Stroke Impact Scale-16 by >9 points indicating patient experiencing increased independence with functional mobility tasks and ADLs to improve quality of life and decrease caregiver burden. (Questionnaire to be completed by his mother) Baseline: 39/80 Goal status: INITIAL  5.  Patient will be modified independent with household level ambulation using LRAD to improve QOL, decrease fall risk, and promote overall increased independence in the home.  Baseline: currently requires constant L HHA to ambulate Goal status: INITIAL  6.  Patient will be able to ascend/descend 4 steps using 1 HR support and no more than SBA to increase his independence with home entry.  Baseline: min A and B HR or B HHA support  Goal status: INITIAL  ASSESSMENT:  CLINICAL IMPRESSION:   Patient arrives motivated to participate in therapy session; however, overall patient has become very perseverative and insistent on his need to have his walking stick for support during all standing/ambulatory tasks, likely due to him starting to have increasing confidence and independence when he is using it. Patient continues to demo overall wider BOS during gait; however, improving with patient having more continuous forward gait pattern with decreased instances of startle reflex. Patient does continue to walk in a high guard posture with downward gaze, but occasionally is able to now look up at environment without pausing his gait. With encouragement, pt agreeable to participate in stair navigation training using 1 HR and walking stick in his hand and pt demos improved upright posture during this compared to prior stair navigation performed with this therapist; however, pt insistent that he prefers to bump up the stairs on his bottom. Pt will continue to benefit from skilled therapy to address remaining deficits in order to improve overall QoL and return to PLOF.     OBJECTIVE IMPAIRMENTS: Abnormal gait, decreased activity tolerance,  decreased balance, decreased cognition, decreased coordination, decreased endurance, decreased knowledge of use of DME, decreased mobility, difficulty walking, decreased strength, and impaired vision/preception.   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   REHAB POTENTIAL: Good  CLINICAL DECISION MAKING: Evolving/moderate complexity  EVALUATION COMPLEXITY: Moderate  PLAN:  PT FREQUENCY: 1-2x/week  PT DURATION: 12 weeks  PLANNED INTERVENTIONS: 97164- PT Re-evaluation, 97750- Physical Performance Testing, 97110-Therapeutic exercises, 97530- Therapeutic activity, [id number]- Neuromuscular re-education, 97535- Self Care, [id number]- Manual therapy, [id number]- Gait training, [phone]- Orthotic Initial, [phone]- Orthotic/Prosthetic subsequent, [phone]- Canalith repositioning, [phone]- Electrical stimulation (manual), Patient/Family education, Balance training, Stair training, Taping, Joint mobilization, Spinal mobilization, Vestibular training, Visual/preceptual  remediation/compensation, Cognitive remediation, DME instructions, Cryotherapy, Moist heat, and Biofeedback  PLAN FOR NEXT SESSION:  - progress HEP  - standing balance  - progression towards more normal BOS (stands with wide base - especially when nervous)   - use of external targets to keep feet in closer BOS (or standing inside brown step that is flipped upside down)  - reaching and head turning in standing ([employer]) - gait training with  decreased reliance on L HHA  - continue use of walking stick (pt has become very reliant on the walking stick) - stair navigation training with use of walking stick and 1 HR to simulate home set-up  - pt wants to continue bumping up on his bottom, but ambulatory stair navigation is improving!    Connell Kiss, PT, DPT, NCS, CSRS Physical Therapist - [place]  [facility]  3:39 PM 04/25/24

## 2024-04-27 ENCOUNTER — Ambulatory Visit: Admitting: Physical Therapy

## 2024-04-27 DIAGNOSIS — R2681 Unsteadiness on feet: Secondary | ICD-10-CM | POA: Diagnosis not present

## 2024-04-27 DIAGNOSIS — R269 Unspecified abnormalities of gait and mobility: Secondary | ICD-10-CM

## 2024-04-27 DIAGNOSIS — R531 Weakness: Secondary | ICD-10-CM

## 2024-04-27 DIAGNOSIS — R262 Difficulty in walking, not elsewhere classified: Secondary | ICD-10-CM

## 2024-04-27 NOTE — Therapy (Signed)
 OUTPATIENT PHYSICAL THERAPY TREATMENT Physical Therapy Progress Note   Dates of reporting period  03/14/2024   to   04/27/2024  Patient Name: Tony Harper MRN: [id number] DOB:Dec 30, 1995, 28 y.o., male Today's Date: 04/27/2024  PCP: Ostwalt, Janna, PA-C REFERRING PROVIDER: Lane Arthea BRAVO, MD   END OF SESSION:   PT End of Session - 04/27/24 1619     Visit Number 10    Number of Visits 24    Date for PT Re-Evaluation 06/06/24    Progress Note Due on Visit 10    PT Start Time 1621    PT Stop Time 1702    PT Time Calculation (min) 41 min    Equipment Utilized During Treatment Gait belt    Activity Tolerance Patient tolerated treatment well    Behavior During Therapy WFL for tasks assessed/performed             Past Medical History:  Diagnosis Date   Cerebral palsy (HCC)    Cysts of right lower eyelid 02/16/2017   Added automatically from request for surgery [id number]     Added automatically from request for surgery [id number]   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)    Neuromuscular disorder (HCC)    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 Patient Active Problem List   Diagnosis Date Noted   Gastroesophageal reflux disease without esophagitis 03/02/2024   Tinnitus of both ears 03/02/2024   Enlarged thyroid  gland 03/02/2024   Acute non-recurrent pansinusitis 08/02/2023   Seasonal allergic rhinitis 08/02/2023   Anxiety and depression 04/08/2023   Encounter for therapeutic drug monitoring 10/17/2018   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) 06/23/2018   Cerebral palsy (HCC) 06/23/2018   Seizure disorder (HCC) 01/09/2018    ONSET DATE: ~Gradually over the past 2 years  REFERRING DIAG: R29.898 (ICD-10-CM) - Leg weakness   THERAPY DIAG:   Unsteadiness on feet  Generalized weakness  Difficulty in walking  Abnormality of gait  Rationale for Evaluation  and Treatment: Rehabilitation  SUBJECTIVE:                                                                                                                                                                                             SUBJECTIVE STATEMENT:    Patient reports he is doing good. Reports still waiting for new tattoo on his R hand to heel. Otherwise, no significant updates. No reports of pain. No reports of falls.    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 Pt's mother noticed pt's strength and balance has declined gradually over the past 2 years. Patient's mother reports pt has R inattention/neglect. Pt's mother reports pt has R hand contracture and requests pt receive OT to address this. Pt's mother reports previously pt could walk independently, although slow with a wide gait and excessive forward trunk flexed posture, but now he is unable to walk without constant L HHA. Pt's mother reports pt previously had a posterior walker, but he fell forward out of it. Mother reports pt also tried a RW and the same thing happened with pt having anterior LOB pushing AD too far out in front of himself (she states same thing happens if patient tries to walk with a buggy in the store). Pt's mother reports when pt navigating steps she provides B HHA rather than him using HRs; otherwise, he will bump up/down the stairs on his bottom.Patient stating he wants to be able to walk without needing HHA from his mother. 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 No  PRECAUTIONS: Fall and Other: hx of seizures (chronic and stable)  WEIGHT BEARING RESTRICTIONS: No  FALLS: Has patient fallen in last 6 months? Yes. Number of falls mother reports pt has frequent falls when he gets up in the middle of the night  to go to bathroom by himself ~1x every 2 weeks  LIVING ENVIRONMENT: Lives with: lives with their family and mother and father Lives in: House/apartment Stairs: Yes: Internal: 5 steps; bilateral but cannot reach both and External: 2 steps; using B HHA from pt's mother Has following equipment at home: shower chair CLOF: Patient scoots on his buttocks to navigate indoor stairs because the handrails are not close enough to reach. Patient using B HHA from his mother to navigate external stairs. Pt requires constant L HHA from his mother to ambulate (otherwise according to pt's mother, he is flexed forward so much that he is almost in quadruped). Mother reports pt can perform stand pivot transfers from EOB to gaming chair mod-I to wheel around in his room in the chair.   PLOF: Independent with household mobility without device, Independent with gait, Independent with transfers, and Needs assistance with ADLs  PATIENT GOALS: walk independently without always needing HHA from his mother, get his R leg as strong his L leg  OBJECTIVE:  Note: Objective measures were completed at evaluation unless otherwise noted.                                                                                                                           TREATMENT DATE: 04/27/2024    Patient ambulated into therapy clinic using walking stick in L UE and R HHA from therapist. Pt initially requests his mother's HHA, but is agreeable to therapist's support with encouragement.    Unless otherwise  stated, CGA/light min A was provided and gait belt donned in order to ensure pt safety throughout session.  Therapy session focused on re-assessment of standardized outcome measures and subjective questionnaire to determine pt's progress with therapy thus far.  Stroke Impact Scale 16 - *completed by patient's mother* (Chief of Staff, [employer])  In the past 2 weeks, how difficult was it to...  Rating  Scale 5 = Not difficult at all 4 = A little difficult 3 = Somewhat difficult 2 = Very difficult 1 = Could not do at all  a. Dress the top part of your body? 2  b. Bathe yourself? 2  c. Get to the toilet on time? 3  d. Control your bladder (not have an accident)? 5  e. Control your bowels (not have an accident)? 5  f. Stand without losing balance? 2  g. Go shopping? 2  h. Do heavy household chores (e.g. vacuum, laundry or  yard work)? 1  i. Stay sitting without losing your balance? 3  j. Walk without losing your balance? 1  k. Move from a bed to a chair? 3  l. Walk fast? 1  m. Climb one flight of stairs? 2  n. Walk one block? 3  o. Get in and out of a car? 3  p. Carry heavy objects (e.g. bag of groceries) with your  affected hand? 1  Sum:  39/80  MDC (Minimal Detectable Change) is >/=8     10 Meter Walk Test - using walking stick in L hand: Patient instructed to walk 10 meters (32.8 ft) as quickly and as safely as possible at their normal speed x2 and at a fast speed x2. Time measured from 2 meter mark to 8 meter mark to accommodate ramp-up and ramp-down.  Normal speed 1: 0.296 m/s (33.78 seconds) Normal speed 2: 0.29 m/s (34.02 seconds) Average Normal speed: 0.29 m/s using L walking stick with CGA for steadying - demos downward gaze, and a few instances of startles Fast speed 1: 0.358 m/s (27.90 seconds) Fast speed 2: 0.364 m/s (27.41 seconds) Average Fast speed: 0.36 m/s using L walking stick with light min A to facilitate weight shifting Cut off scores: <0.4 m/s = household Ambulator, 0.4-0.8 m/s = limited community Ambulator, >0.8 m/s = community Ambulator, >1.2 m/s = crossing a street, <1.0 = increased fall risk MCID 0.05 m/s (small), 0.13 m/s (moderate), 0.06 m/s (significant)  (ANPTA Core Set of Outcome Measures for Adults with Neurologic Conditions, 2018)  10 Meter Walk Test - with L HHA: Patient instructed to walk 10 meters (32.8 ft) as quickly and as safely as  possible at their normal speed x2 and at a fast speed x2. Time measured from 2 meter mark to 8 meter mark to accommodate ramp-up and ramp-down.  Normal speed 1: 0.535 m/s (18.66 seconds) Normal speed 2: 0.548 m/s (18.23 seconds) Average Normal speed: 0.54 m/s using L HHA providing min A Fast speed 1: 0.59 m/s (16.86 seconds) Fast speed 2: 0.63 m/s (15.86 seconds) Average Fast speed: 0.61 m/s using L HHA providing min A Cut off scores: <0.4 m/s = household Ambulator, 0.4-0.8 m/s = limited community Ambulator, >0.8 m/s = community Ambulator, >1.2 m/s = crossing a street, <1.0 = increased fall risk MCID 0.05 m/s (small), 0.13 m/s (moderate), 0.06 m/s (significant)  (ANPTA Core Set of Outcome Measures for Adults with Neurologic Conditions, 2018)    Patient participated in [place] Balance Test and demonstrates increased fall risk as noted by score  of 21/56.  (<36= high risk for falls, close to 100%; 37-45 significant >80%; 46-51 moderate >50%; 52-55 lower >25%). Patient demonstrates improvement in his transfers, standing unsupported, and standing reaching task.    [facility] PT Assessment - 04/27/24 0001       Berg Balance Test   Sit to Stand Able to stand  independently using hands    Standing Unsupported Able to stand 2 minutes with supervision   pt requires encouragement and watching of the timer to achieve successfully, otherwise has to sit at - continues to have wider BOS and more forward/crouched posture   Sitting with Back Unsupported but Feet Supported on Floor or Stool Able to sit safely and securely 2 minutes    Stand to Sit Sits safely with minimal use of hands    Transfers Able to transfer with verbal cueing and /or supervision    Standing Unsupported with Eyes Closed Needs help to keep from falling   pt not agreeable to attempt this   Standing Unsupported with Feet Together Needs help to attain position and unable to hold for 15 seconds   pt unable to achieve full NBOS & unable to  stand with a normal BOS without UE support (immediately widens his BOS when UE support removed)   From Standing, Reach Forward with Outstretched Arm Reaches forward but needs supervision    From Standing Position, Pick up Object from Floor Able to pick up shoe, needs supervision    From Standing Position, Turn to Look Behind Over each Shoulder Needs assist to keep from losing balance and falling    Turn 360 Degrees Needs assistance while turning    Standing Unsupported, Alternately Place Feet on Step/Stool Able to complete >2 steps/needs minimal assist    Standing Unsupported, One Foot in [employer] balance while stepping or standing    Standing on One Leg Unable to try or needs assist to prevent fall    Total Score 21         Patient and mother educated on findings of assessments today as described below.    PATIENT EDUCATION: Education details: PT POC, findings on assessment, HEP; importance of multiplanar gait for safe access to community  Person educated: Patient and Mother Education method: Explanation and Handouts Education comprehension: verbalized understanding and needs further education  HOME EXERCISE PROGRAM: Access Code: [email or web address] URL: [email or web address] Date: 03/14/2024 Prepared by: Connell Kiss  Exercises - Step Taps with Counter Support  - 1 x daily - 7 x weekly - 2 sets - 10 reps  GOALS: Goals reviewed with patient? Yes  SHORT TERM GOALS: Target date: 04/25/2024  Patient will be independent with home exercise program to improve strength/mobility for increased functional independence with ADLs and mobility.  Baseline: initiated on 7/22 and pt will require assistance from his mother 04/27/2024: need to progress Goal status: INITIAL   LONG TERM GOALS: Target date: 06/06/2024  Patient will increase Berg Balance score to > 51/56 to demonstrate improved balance and decreased fall risk during functional activities and ADLs.  Baseline: 03/30/2024:  17/56 04/27/2024: 21/56 Goal status: IN PROGRESS  2.  Patient will increase six minute walk test distance to >1076ft, using LRAD with no more than SBA, for progression to more independent community level ambulation, demonstrating improved gait endurance. Baseline: 03/30/2024: 653 feet (199 meters, Avg speed 0.55 m/s) using L HHA providing min A. 04/27/2024: need to assess Goal status: IN PROGRESS  3.  Patient will increase 10 meter walk test to >  1.36m/s using LRAD mod-I as to improve gait speed for better community ambulation and to reduce fall risk. Baseline: 0.87m/s using L HHA for min A  04/27/2024: Average Normal speed: 0.29 m/s using L walking stick with CGA for steadying & Average Fast speed: 0.36 m/s using L walking stick with light min A to facilitate weight shifting ---- VERSUS --- using L HHA providing min A Average Normal speed: 0.54 m/s & Average Fast speed: 0.61 m/s using Goal status: IN PROGRESS  4.  Patient will improve Stroke Impact Scale-16 by >9 points indicating patient experiencing increased independence with functional mobility tasks and ADLs to improve quality of life and decrease caregiver burden. (Questionnaire to be completed by his mother) Baseline: 39/80 04/27/2024: 39/80 Goal status: IN PROGRESS  5.  Patient will be modified independent with household level ambulation using LRAD to improve QOL, decrease fall risk, and promote overall increased independence in the home.  Baseline: currently requires constant L HHA to ambulate, providing min A 04/27/2024: gait with walking stick in L UE support at CGA level Goal status: IN PROGRESS  6.  Patient will be able to ascend/descend 4 steps using 1 HR support and no more than SBA to increase his independence with home entry.  Baseline: min A and B HR or B HHA support  04/27/2024: L UE support on HR, carrying walking stick in R UE, and light min A (pt continues to prefer to bump up the stairs instead) Goal status: IN  PROGRESS  ASSESSMENT:  CLINICAL IMPRESSION:   Patient arrives motivated to participate in therapy session. Patient continues to be very perseverative and insistent on his need to have his walking stick for support during all standing/ambulatory tasks; however, with gentle education on reasoning/purpose of removing that support to assess his balance and progress at this time, he was agreeable to participating without it. Therapy session focused on re-assessment of standardized outcome measures and subjective questionnaire to determine pt's progress with therapy thus far. Patient's mother completes the subjective questionnaire and reports overall no significant change in patient's independence with functional mobility and ADLs. Patient demonstrates improvement on the Berg showing improved standing balance with static stance, transfers, and reaching tasks with decreased reliance on UE support; however, this remains really challenging with him tending to have progressively wider BOS and more forward/crouched gait when he has increased fear of falling during balance tasks. Patient is now able to participate in test using L UE support on walking stick with CGA; however, has decreased gait speed compared to when using L HHA. Patient educated on goal to improve his confidence with ambulating at a faster speed while using the walking stick to also allow him increased independence. Pt will continue to benefit from skilled therapy to address remaining deficits in order to improve overall QoL and return to PLOF. Patient's condition has the potential to improve in response to therapy. Maximum improvement is yet to be obtained. The anticipated improvement is attainable and reasonable in a generally predictable time.     OBJECTIVE IMPAIRMENTS: Abnormal gait, decreased activity tolerance, decreased balance, decreased cognition, decreased coordination, decreased endurance, decreased knowledge of use of DME, decreased  mobility, difficulty walking, decreased strength, and impaired vision/preception.   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   REHAB POTENTIAL: Good  CLINICAL DECISION MAKING: Evolving/moderate complexity  EVALUATION COMPLEXITY: Moderate  PLAN:  PT FREQUENCY: 1-2x/week  PT DURATION: 12 weeks  PLANNED INTERVENTIONS: 97164- PT Re-evaluation, 97750- Physical Performance Testing, 97110-Therapeutic exercises, 97530- Therapeutic activity, [id number]- Neuromuscular re-education, 97535- Self Care, [id number]- Manual therapy, [phone]- Gait training, [phone]- Orthotic Initial, [phone]- Orthotic/Prosthetic subsequent, [phone]- Canalith repositioning, [phone]- Electrical stimulation (manual), Patient/Family education, Balance training, Stair training, Taping, Joint mobilization, Spinal mobilization, Vestibular training, Visual/preceptual remediation/compensation, Cognitive remediation, DME instructions, Cryotherapy, Moist heat, and Biofeedback  PLAN FOR NEXT SESSION:  & update LTGs Progress & update HEP  - standing balance  - progression towards more normal BOS (stands with wide base - especially when nervous)   - use of external targets to keep feet in closer BOS (or standing inside brown step that is flipped upside down)  - reaching and head turning in standing ([employer]) - fast gait training - working on increasing his speed - try treadmill (pt educated that this may be a goal because it will  also force a more narrow BOS) - gait training with decreased reliance on L HHA  - try to only intermittently use walking stick (pt has become very reliant on the walking stick) - stair navigation training with use of walking stick and 1 HR to simulate home set-up  - pt wants to continue bumping up on his bottom, but ambulatory stair navigation is improving!    Connell Kiss, PT, DPT, NCS, CSRS Physical Therapist - [place]  [facility]  5:57 PM 04/27/24

## 2024-05-02 ENCOUNTER — Ambulatory Visit

## 2024-05-02 DIAGNOSIS — R531 Weakness: Secondary | ICD-10-CM

## 2024-05-02 DIAGNOSIS — R2681 Unsteadiness on feet: Secondary | ICD-10-CM | POA: Diagnosis not present

## 2024-05-02 DIAGNOSIS — R262 Difficulty in walking, not elsewhere classified: Secondary | ICD-10-CM

## 2024-05-02 DIAGNOSIS — R269 Unspecified abnormalities of gait and mobility: Secondary | ICD-10-CM

## 2024-05-02 NOTE — Therapy (Signed)
 OUTPATIENT PHYSICAL THERAPY TREATMENT  Patient Name: Tony Harper MRN: [id number] DOB:23-Sep-1995, 28 y.o., male Today's Date: 05/02/2024  PCP: Ostwalt, Janna, PA-C REFERRING PROVIDER: Lane Arthea BRAVO, MD   END OF SESSION:   PT End of Session - 05/02/24 1405     Visit Number 11    Number of Visits 24    Date for PT Re-Evaluation 06/06/24    Progress Note Due on Visit 10    PT Start Time 1400    PT Stop Time 1440    PT Time Calculation (min) 40 min    Equipment Utilized During Treatment Gait belt    Activity Tolerance Patient tolerated treatment well    Behavior During Therapy WFL for tasks assessed/performed          Past Medical History:  Diagnosis Date   Cerebral palsy (HCC)    Cysts of right lower eyelid 02/16/2017   Added automatically from request for surgery [id number]     Added automatically from request for surgery [id number]   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)    Neuromuscular disorder (HCC)    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 Patient Active Problem List   Diagnosis Date Noted   Gastroesophageal reflux disease without esophagitis 03/02/2024   Tinnitus of both ears 03/02/2024   Enlarged thyroid  gland 03/02/2024   Acute non-recurrent pansinusitis 08/02/2023   Seasonal allergic rhinitis 08/02/2023   Anxiety and depression 04/08/2023   Encounter for therapeutic drug monitoring 10/17/2018   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) 06/23/2018   Cerebral palsy (HCC) 06/23/2018   Seizure disorder (HCC) 01/09/2018    ONSET DATE: ~Gradually over the past 2 years  REFERRING DIAG: R29.898 (ICD-10-CM) - Leg weakness   THERAPY DIAG:   Unsteadiness on feet  Generalized weakness  Difficulty in walking  Abnormality of gait  Rationale for Evaluation and Treatment: Rehabilitation  SUBJECTIVE:                                                                                                                                                                                              SUBJECTIVE STATEMENT:   Pt doing well today. Tattoo is still peeling. Mom reports no significant changes since last session.   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 Pt's mother noticed pt's strength and balance has declined gradually over the past 2 years. Patient's mother reports pt has R inattention/neglect. Pt's  mother reports pt has R hand contracture and requests pt receive OT to address this. Pt's mother reports previously pt could walk independently, although slow with a wide gait and excessive forward trunk flexed posture, but now he is unable to walk without constant L HHA. Pt's mother reports pt previously had a posterior walker, but he fell forward out of it. Mother reports pt also tried a RW and the same thing happened with pt having anterior LOB pushing AD too far out in front of himself (she states same thing happens if patient tries to walk with a buggy in the store). Pt's mother reports when pt navigating steps she provides B HHA rather than him using HRs; otherwise, he will bump up/down the stairs on his bottom.Patient stating he wants to be able to walk without needing HHA from his mother. 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 No pain, just soreness in thighs.   PRECAUTIONS: Fall and Other: hx of seizures (chronic and stable)  WEIGHT BEARING RESTRICTIONS: No  FALLS: Has patient fallen in last 6 months? Yes. Number of falls mother reports pt has frequent falls when he gets up in the middle of the night to go to bathroom by himself ~1x every 2 weeks  LIVING ENVIRONMENT: Lives with: lives with their family and mother and father Lives in:  House/apartment Stairs: Yes: Internal: 5 steps; bilateral but cannot reach both and External: 2 steps; using B HHA from pt's mother Has following equipment at home: shower chair CLOF: Patient scoots on his buttocks to navigate indoor stairs because the handrails are not close enough to reach. Patient using B HHA from his mother to navigate external stairs. Pt requires constant L HHA from his mother to ambulate (otherwise according to pt's mother, he is flexed forward so much that he is almost in quadruped). Mother reports pt can perform stand pivot transfers from EOB to gaming chair mod-I to wheel around in his room in the chair.   PLOF: Independent with household mobility without device, Independent with gait, Independent with transfers, and Needs assistance with ADLs  PATIENT GOALS: walk independently without always needing HHA from his mother, get his R leg as strong his L leg  OBJECTIVE:  Note: Objective measures were completed at evaluation unless otherwise noted.                                                                                                                           TREATMENT DATE: 05/02/2024   -AMB overground LUE treking pole; 446ft in 35m27sec (320ft in 6 minutes)  *lengthy rest break -AMB overground LUE treking pole; 427ft in 35m51sec *lengthy rest break -AMB with cane to // bars, then in narrow // bars AMB over 2 step and 5 step with RUE only support on bar 6x total over 85ft    PATIENT EDUCATION: Education details: PT POC, findings on assessment,  HEP; importance of multiplanar gait for safe access to community  Person educated: Patient and Mother Education method: Explanation and Handouts Education comprehension: verbalized understanding and needs further education  HOME EXERCISE PROGRAM: Access Code: [email or web address] URL: [email or web address] Date: 03/14/2024 Prepared by: Connell Kiss  Exercises - Step Taps with Counter Support  - 1 x daily - 7 x  weekly - 2 sets - 10 reps  GOALS: Goals reviewed with patient? Yes  SHORT TERM GOALS: Target date: 04/25/2024  Patient will be independent with home exercise program to improve strength/mobility for increased functional independence with ADLs and mobility.  Baseline: initiated on 7/22 and pt will require assistance from his mother 04/27/2024: need to progress Goal status: INITIAL  LONG TERM GOALS: Target date: 06/06/2024  Patient will increase Berg Balance score to > 51/56 to demonstrate improved balance and decreased fall risk during functional activities and ADLs.  Baseline: 03/30/2024: 17/56 04/27/2024: 21/56 Goal status: IN PROGRESS  2.  Patient will increase six minute walk test distance to >1075ft, using LRAD with no more than SBA, for progression to more independent community level ambulation, demonstrating improved gait endurance. Baseline: 03/30/2024: 653 feet (199 meters, Avg speed 0.55 m/s) using L HHA providing min A; 05/02/24: 361ft c trekking pole.  Goal status: IN PROGRESS  3.  Patient will increase 10 meter walk test to >1.63m/s using LRAD mod-I as to improve gait speed for better community ambulation and to reduce fall risk. Baseline: 0.34m/s using L HHA for min A  04/27/2024: Average Normal speed: 0.29 m/s using L walking stick with CGA for steadying & Average Fast speed: 0.36 m/s using L walking stick with light min A to facilitate weight shifting ---- VERSUS --- using L HHA providing min A Average Normal speed: 0.54 m/s & Average Fast speed: 0.61 m/s using Goal status: IN PROGRESS  4.  Patient will improve Stroke Impact Scale-16 by >9 points indicating patient experiencing increased independence with functional mobility tasks and ADLs to improve quality of life and decrease caregiver burden. (Questionnaire to be completed by his mother) Baseline: 39/80 04/27/2024: 39/80 Goal status: IN PROGRESS  5.  Patient will be modified independent with household level ambulation using LRAD to  improve QOL, decrease fall risk, and promote overall increased independence in the home.  Baseline: currently requires constant L HHA to ambulate, providing min A 04/27/2024: gait with walking stick in L UE support at CGA level Goal status: IN PROGRESS  6.  Patient will be able to ascend/descend 4 steps using 1 HR support and no more than SBA to increase his independence with home entry.  Baseline: min A and B HR or B HHA support  04/27/2024: L UE support on HR, carrying walking stick in R UE, and light min A (pt continues to prefer to bump up the stairs instead) Goal status: IN PROGRESS  ASSESSMENT:  CLINICAL IMPRESSION:   Worked on longer distance walking, able to cover 446ft twice, a big improvement since starting PT. Pt's legs get fatigued and sore still, but he has fewer LOB then on previous sessions. Pt will continue to benefit from skilled therapy to address remaining deficits in order to improve overall QoL and return to PLOF. Patient's condition has the potential to improve in response to therapy. Maximum improvement is yet to be obtained. The anticipated improvement is attainable and reasonable in a generally predictable time.     OBJECTIVE IMPAIRMENTS: Abnormal gait, decreased activity tolerance, decreased balance, decreased cognition, decreased coordination, decreased endurance, decreased knowledge  of use of DME, decreased mobility, difficulty walking, decreased strength, and impaired vision/preception.   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   REHAB POTENTIAL: Good  CLINICAL DECISION MAKING: Evolving/moderate complexity  EVALUATION COMPLEXITY: Moderate  PLAN:  PT FREQUENCY: 1-2x/week  PT DURATION: 12 weeks  PLANNED INTERVENTIONS: 97164- PT Re-evaluation, 97750- Physical Performance Testing, 97110-Therapeutic exercises, 97530- Therapeutic activity, [id number]- Neuromuscular re-education, 97535- Self Care, 02859- Manual therapy, [id number]- Gait training, [phone]- Orthotic Initial, [phone]- Orthotic/Prosthetic subsequent, [phone]- Canalith repositioning, [phone]- Electrical stimulation (manual), Patient/Family education, Balance training, Stair training, Taping, Joint mobilization, Spinal mobilization, Vestibular training, Visual/preceptual remediation/compensation, Cognitive remediation, DME instructions, Cryotherapy, Moist heat, and Biofeedback  PLAN FOR NEXT SESSION:   Progress & update HEP  - standing balance  - progression towards more normal BOS (stands with wide base - especially when nervous)  - use of external targets to keep feet in closer BOS (or standing inside brown step that is flipped upside down)  - reaching and head turning in standing (Blaze Pods) - fast gait training - working on increasing his speed - try treadmill (pt educated that this may be a goal because it will also force a more narrow BOS) - gait training with decreased reliance on L HHA  - try to only intermittently use walking stick (pt has become very reliant on the walking stick) - stair navigation training with use of walking stick and 1 HR to simulate home set-up  - pt wants to continue bumping up on his bottom, but ambulatory stair navigation is improving!  2:34 PM, 05/02/24 Peggye JAYSON Linear, PT, DPT Physical Therapist - [facility]  Outpatient Physical Therapy- Main Campus [phone]

## 2024-05-04 ENCOUNTER — Ambulatory Visit

## 2024-05-09 ENCOUNTER — Ambulatory Visit

## 2024-05-09 DIAGNOSIS — R2681 Unsteadiness on feet: Secondary | ICD-10-CM | POA: Diagnosis not present

## 2024-05-09 DIAGNOSIS — R262 Difficulty in walking, not elsewhere classified: Secondary | ICD-10-CM

## 2024-05-09 DIAGNOSIS — R269 Unspecified abnormalities of gait and mobility: Secondary | ICD-10-CM

## 2024-05-09 DIAGNOSIS — R531 Weakness: Secondary | ICD-10-CM

## 2024-05-09 NOTE — Therapy (Signed)
 OUTPATIENT PHYSICAL THERAPY TREATMENT  Patient Name: Tony Harper MRN: [id number] DOB:Nov 22, 1995, 28 y.o., male Today's Date: 05/09/2024  PCP: Ostwalt, Janna, PA-C REFERRING PROVIDER: Lane Arthea BRAVO, MD   END OF SESSION:   PT End of Session - 05/09/24 1418     Visit Number 12    Number of Visits 24    Date for PT Re-Evaluation 06/06/24    Progress Note Due on Visit 20    PT Start Time 1406    PT Stop Time 1445    PT Time Calculation (min) 39 min    Equipment Utilized During Treatment Gait belt    Activity Tolerance Patient tolerated treatment well;No increased pain    Behavior During Therapy [facility] for tasks assessed/performed          Past Medical History:  Diagnosis Date   Cerebral palsy (HCC)    Cysts of right lower eyelid 02/16/2017   Added automatically from request for surgery [id number]     Added automatically from request for surgery [id number]   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)    Neuromuscular disorder [facility])    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 Patient Active Problem List   Diagnosis Date Noted   Gastroesophageal reflux disease without esophagitis 03/02/2024   Tinnitus of both ears 03/02/2024   Enlarged thyroid  gland 03/02/2024   Acute non-recurrent pansinusitis 08/02/2023   Seasonal allergic rhinitis 08/02/2023   Anxiety and depression 04/08/2023   Encounter for therapeutic drug monitoring 10/17/2018   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) 06/23/2018   Cerebral palsy (HCC) 06/23/2018   Seizure disorder (HCC) 01/09/2018    ONSET DATE: ~Gradually over the past 2 years  REFERRING DIAG: R29.898 (ICD-10-CM) - Leg weakness   THERAPY DIAG:   Unsteadiness on feet  Generalized weakness  Difficulty in walking  Abnormality of gait  Rationale for Evaluation and Treatment: Rehabilitation  SUBJECTIVE:                                                                                                                                                                                              SUBJECTIVE STATEMENT:   Pt doing well today. No updates.  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 Pt's mother noticed pt's strength and balance has declined gradually over the past 2 years. Patient's mother reports pt has R inattention/neglect. Pt's mother reports pt has R hand contracture and requests  pt receive OT to address this. Pt's mother reports previously pt could walk independently, although slow with a wide gait and excessive forward trunk flexed posture, but now he is unable to walk without constant L HHA. Pt's mother reports pt previously had a posterior walker, but he fell forward out of it. Mother reports pt also tried a RW and the same thing happened with pt having anterior LOB pushing AD too far out in front of himself (she states same thing happens if patient tries to walk with a buggy in the store). Pt's mother reports when pt navigating steps she provides B HHA rather than him using HRs; otherwise, he will bump up/down the stairs on his bottom.Patient stating he wants to be able to walk without needing HHA from his mother. 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 No pain, just soreness in thighs.   PRECAUTIONS: Fall and Other: hx of seizures (chronic and stable)  WEIGHT BEARING RESTRICTIONS: No  FALLS: Has patient fallen in last 6 months? Yes. Number of falls mother reports pt has frequent falls when he gets up in the middle of the night to go to bathroom by himself ~1x every 2 weeks  LIVING ENVIRONMENT: Lives with: lives with their family and mother and father Lives in: House/apartment Stairs: Yes: Internal: 5 steps;  bilateral but cannot reach both and External: 2 steps; using B HHA from pt's mother Has following equipment at home: shower chair CLOF: Patient scoots on his buttocks to navigate indoor stairs because the handrails are not close enough to reach. Patient using B HHA from his mother to navigate external stairs. Pt requires constant L HHA from his mother to ambulate (otherwise according to pt's mother, he is flexed forward so much that he is almost in quadruped). Mother reports pt can perform stand pivot transfers from EOB to gaming chair mod-I to wheel around in his room in the chair.   PLOF: Independent with household mobility without device, Independent with gait, Independent with transfers, and Needs assistance with ADLs  PATIENT GOALS: walk independently without always needing HHA from his mother, get his R leg as strong his L leg  OBJECTIVE:  Note: Objective measures were completed at evaluation unless otherwise noted.                                                                                                                           TREATMENT DATE: 05/09/2024   -AMB overground LUE treking pole; 389ft -forward AMB in bars c 4 step, 6 Step 3x rout trips, noted fatigue of RLE extensor in gait.  *same as above with 2lb AW  **used // bars to promote more narrow base of support successfully  -2 laps side stepping c 2lbs  -5x sts c 3kg     PATIENT EDUCATION: Education details: PT POC, findings on assessment, HEP; importance of multiplanar gait for safe  access to community  Person educated: Patient and Mother Education method: Explanation and Handouts Education comprehension: verbalized understanding and needs further education  HOME EXERCISE PROGRAM: Access Code: [email or web address] URL: [email or web address] Date: 03/14/2024 Prepared by: Connell Kiss  Exercises - Step Taps with Counter Support  - 1 x daily - 7 x weekly - 2 sets - 10 reps  GOALS: Goals reviewed with patient?  Yes  SHORT TERM GOALS: Target date: 04/25/2024  Patient will be independent with home exercise program to improve strength/mobility for increased functional independence with ADLs and mobility.  Baseline: initiated on 7/22 and pt will require assistance from his mother 04/27/2024: need to progress Goal status: INITIAL  LONG TERM GOALS: Target date: 06/06/2024  Patient will increase Berg Balance score to > 51/56 to demonstrate improved balance and decreased fall risk during functional activities and ADLs.  Baseline: 03/30/2024: 17/56 04/27/2024: 21/56 Goal status: IN PROGRESS  2.  Patient will increase six minute walk test distance to >1063ft, using LRAD with no more than SBA, for progression to more independent community level ambulation, demonstrating improved gait endurance. Baseline: 03/30/2024: 653 feet (199 meters, Avg speed 0.55 m/s) using L HHA providing min A; 05/02/24: 368ft c trekking pole.  Goal status: IN PROGRESS  3.  Patient will increase 10 meter walk test to >1.17m/s using LRAD mod-I as to improve gait speed for better community ambulation and to reduce fall risk. Baseline: 0.58m/s using L HHA for min A  04/27/2024: Average Normal speed: 0.29 m/s using L walking stick with CGA for steadying & Average Fast speed: 0.36 m/s using L walking stick with light min A to facilitate weight shifting ---- VERSUS --- using L HHA providing min A Average Normal speed: 0.54 m/s & Average Fast speed: 0.61 m/s using Goal status: IN PROGRESS  4.  Patient will improve Stroke Impact Scale-16 by >9 points indicating patient experiencing increased independence with functional mobility tasks and ADLs to improve quality of life and decrease caregiver burden. (Questionnaire to be completed by his mother) Baseline: 39/80 04/27/2024: 39/80 Goal status: IN PROGRESS  5.  Patient will be modified independent with household level ambulation using LRAD to improve QOL, decrease fall risk, and promote overall increased  independence in the home.  Baseline: currently requires constant L HHA to ambulate, providing min A 04/27/2024: gait with walking stick in L UE support at CGA level Goal status: IN PROGRESS  6.  Patient will be able to ascend/descend 4 steps using 1 HR support and no more than SBA to increase his independence with home entry.  Baseline: min A and B HR or B HHA support  04/27/2024: L UE support on HR, carrying walking stick in R UE, and light min A (pt continues to prefer to bump up the stairs instead) Goal status: IN PROGRESS  ASSESSMENT:  CLINICAL IMPRESSION:   Cont on work on longer distance walking, able to cover 471ft previous session, but not today. Pt moving more slowly today and much wider in stance. Pt's legs get fatigued with step ups. Pt will continue to benefit from skilled therapy to address remaining deficits in order to improve overall QoL and return to PLOF. Patient's condition has the potential to improve in response to therapy. Maximum improvement is yet to be obtained. The anticipated improvement is attainable and reasonable in a generally predictable time.     OBJECTIVE IMPAIRMENTS: Abnormal gait, decreased activity tolerance, decreased balance, decreased cognition, decreased coordination, decreased endurance, decreased knowledge of use of DME, decreased mobility,  difficulty walking, decreased strength, and impaired vision/preception.   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   REHAB POTENTIAL: Good  CLINICAL DECISION MAKING: Evolving/moderate complexity  EVALUATION COMPLEXITY: Moderate  PLAN:  PT FREQUENCY: 1-2x/week  PT DURATION: 12 weeks  PLANNED INTERVENTIONS: 97164- PT Re-evaluation, 97750- Physical Performance Testing, 97110-Therapeutic exercises, 97530- Therapeutic activity, 97112- Neuromuscular re-education, 97535- Self Care, 02859- Manual therapy, [phone]- Gait training, [phone]- Orthotic Initial, [phone]- Orthotic/Prosthetic subsequent, [phone]- Canalith repositioning, [phone]- Electrical stimulation (manual), Patient/Family education, Balance training, Stair training, Taping, Joint mobilization, Spinal mobilization, Vestibular training, Visual/preceptual remediation/compensation, Cognitive remediation, DME instructions, Cryotherapy, Moist heat, and Biofeedback  PLAN FOR NEXT SESSION:   - standing balance  - progression towards more normal BOS (stands with wide base - especially when nervous)  - use of external targets to keep feet in closer BOS (or standing inside brown step that is flipped upside down)  - reaching and head turning in standing (Blaze Pods) - fast gait training - working on increasing his speed - try treadmill (pt educated that this may be a goal because it will also force a more narrow BOS) - gait training with decreased reliance on L HHA  - try to only intermittently use walking stick (pt has become very reliant on the walking stick) - stair navigation training with use of walking stick and 1 HR to simulate home set-up  - pt wants to continue bumping up on his bottom, but ambulatory stair navigation is improving  2:26 PM, 05/09/24 Peggye JAYSON Linear, PT, DPT Physical Therapist - [facility]  Outpatient Physical Therapy- Main Campus [phone]

## 2024-05-11 ENCOUNTER — Telehealth: Payer: Self-pay

## 2024-05-11 ENCOUNTER — Ambulatory Visit

## 2024-05-11 NOTE — Therapy (Deleted)
 Entered in error

## 2024-05-11 NOTE — Telephone Encounter (Signed)
 Pt did not arrive for scheduled appointment. No telephone call nor message preceeded this absence. Author attempted to contact pt via telephone number listed in chart. Automated message indicates that this is not a working number.   2:30 PM, 05/11/24 Peggye JAYSON Linear, PT, DPT Physical Therapist - [facility]  Outpatient Physical Therapy- Main Campus [phone]

## 2024-05-16 ENCOUNTER — Ambulatory Visit

## 2024-05-16 DIAGNOSIS — R269 Unspecified abnormalities of gait and mobility: Secondary | ICD-10-CM

## 2024-05-16 DIAGNOSIS — R2681 Unsteadiness on feet: Secondary | ICD-10-CM | POA: Diagnosis not present

## 2024-05-16 DIAGNOSIS — R531 Weakness: Secondary | ICD-10-CM

## 2024-05-16 DIAGNOSIS — R262 Difficulty in walking, not elsewhere classified: Secondary | ICD-10-CM

## 2024-05-16 NOTE — Therapy (Signed)
 OUTPATIENT PHYSICAL THERAPY TREATMENT  Patient Name: Tony Harper MRN: [id number] DOB:[birth year], 28 y.o., male Today's Date: 05/16/2024  PCP: Ostwalt, Janna, PA-C REFERRING PROVIDER: Lane Arthea BRAVO, MD   END OF SESSION:   PT End of Session - 05/16/24 1331     Visit Number 13    Number of Visits 24    Date for Recertification  06/06/24    Authorization Type Medicare A&B; Medicaid secondary    Progress Note Due on Visit 20    PT Start Time 1317    PT Stop Time 1357    PT Time Calculation (min) 40 min    Equipment Utilized During Treatment Gait belt    Activity Tolerance Patient tolerated treatment well;No increased pain    Behavior During Therapy [facility] for tasks assessed/performed          Past Medical History:  Diagnosis Date   Cerebral palsy (HCC)    Cysts of right lower eyelid 02/16/2017   Added automatically from request for surgery [id number]     Added automatically from request for surgery [id number]   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)    Neuromuscular disorder [facility])    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 Patient Active Problem List   Diagnosis Date Noted   Gastroesophageal reflux disease without esophagitis 03/02/2024   Tinnitus of both ears 03/02/2024   Enlarged thyroid  gland 03/02/2024   Acute non-recurrent pansinusitis 08/02/2023   Seasonal allergic rhinitis 08/02/2023   Anxiety and depression 04/08/2023   Encounter for therapeutic drug monitoring 10/17/2018   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) 06/23/2018   Cerebral palsy (HCC) 06/23/2018   Seizure disorder (HCC) 01/09/2018    ONSET DATE: ~Gradually over the past 2 years  REFERRING DIAG: R29.898 (ICD-10-CM) - Leg weakness   THERAPY DIAG:   Unsteadiness on feet  Generalized weakness  Difficulty in walking  Abnormality of gait  Rationale for Evaluation and Treatment:  Rehabilitation  SUBJECTIVE:                                                                                                                                                                                             SUBJECTIVE STATEMENT:   Pt doing well today. Mom was sick last week. Got an updated number. Dad was cocnerned that trekking pooel was making pt walk worse in quality, hence pt decided to stop using it.  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 Pt's mother noticed pt's strength and balance has declined gradually over the past 2 years. Patient's mother reports pt has R inattention/neglect. Pt's mother reports pt has R hand contracture and requests pt receive OT to address this. Pt's mother reports previously pt could walk independently, although slow with a wide gait and excessive forward trunk flexed posture, but now he is unable to walk without constant L HHA. Pt's mother reports pt previously had a posterior walker, but he fell forward out of it. Mother reports pt also tried a RW and the same thing happened with pt having anterior LOB pushing AD too far out in front of himself (she states same thing happens if patient tries to walk with a buggy in the store). Pt's mother reports when pt navigating steps she provides B HHA rather than him using HRs; otherwise, he will bump up/down the stairs on his bottom.Patient stating he wants to be able to walk without needing HHA from his mother. 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 No pain, just soreness in thighs.   PRECAUTIONS: Fall and Other: hx of seizures (chronic and stable)  WEIGHT BEARING RESTRICTIONS: No  FALLS: Has patient fallen in last 6 months? Yes. Number of falls mother reports pt has frequent falls when he gets up in the  middle of the night to go to bathroom by himself ~1x every 2 weeks  LIVING ENVIRONMENT: Lives with: lives with their family and mother and father Lives in: House/apartment Stairs: Yes: Internal: 5 steps; bilateral but cannot reach both and External: 2 steps; using B HHA from pt's mother Has following equipment at home: shower chair CLOF: Patient scoots on his buttocks to navigate indoor stairs because the handrails are not close enough to reach. Patient using B HHA from his mother to navigate external stairs. Pt requires constant L HHA from his mother to ambulate (otherwise according to pt's mother, he is flexed forward so much that he is almost in quadruped). Mother reports pt can perform stand pivot transfers from EOB to gaming chair mod-I to wheel around in his room in the chair.   PLOF: Independent with household mobility without device, Independent with gait, Independent with transfers, and Needs assistance with ADLs  PATIENT GOALS: walk independently without always needing HHA from his mother, get his R leg as strong his L leg  OBJECTIVE:  Note: Objective measures were completed at evaluation unless otherwise noted.                                                                                                                           TREATMENT DATE: 05/16/2024   AMB in // bars with 4 step up/down 6 step up/down 3 RT  AMB in // bars with 4 step up/down 6 step up/down 3 RT c 2lb AW (with ball toss/catch on seach step)  AMB in //  bars over red mat with 9lb AW bilat 4RT Standing ball toss c basketball x8  Stand, catch, toss, sit x10     PATIENT EDUCATION: Education details: PT POC, findings on assessment, HEP; importance of multiplanar gait for safe access to community  Person educated: Patient and Mother Education method: Explanation and Handouts Education comprehension: verbalized understanding and needs further education  HOME EXERCISE PROGRAM: Access Code: [email or web address] URL:  [email or web address] Date: 03/14/2024 Prepared by: Connell Kiss  Exercises - Step Taps with Counter Support  - 1 x daily - 7 x weekly - 2 sets - 10 reps  GOALS: Goals reviewed with patient? Yes  SHORT TERM GOALS: Target date: 04/25/2024  Patient will be independent with home exercise program to improve strength/mobility for increased functional independence with ADLs and mobility.  Baseline: initiated on 7/22 and pt will require assistance from his mother 04/27/2024: need to progress Goal status: INITIAL  LONG TERM GOALS: Target date: 06/06/2024  Patient will increase Berg Balance score to > 51/56 to demonstrate improved balance and decreased fall risk during functional activities and ADLs.  Baseline: 03/30/2024: 17/56 04/27/2024: 21/56 Goal status: IN PROGRESS  2.  Patient will increase six minute walk test distance to >1073ft, using LRAD with no more than SBA, for progression to more independent community level ambulation, demonstrating improved gait endurance. Baseline: 03/30/2024: 653 feet (199 meters, Avg speed 0.55 m/s) using L HHA providing min A; 05/02/24: 332ft c trekking pole.  Goal status: IN PROGRESS  3.  Patient will increase 10 meter walk test to >1.70m/s using LRAD mod-I as to improve gait speed for better community ambulation and to reduce fall risk. Baseline: 0.73m/s using L HHA for min A  04/27/2024: Average Normal speed: 0.29 m/s using L walking stick with CGA for steadying & Average Fast speed: 0.36 m/s using L walking stick with light min A to facilitate weight shifting ---- VERSUS --- using L HHA providing min A Average Normal speed: 0.54 m/s & Average Fast speed: 0.61 m/s using Goal status: IN PROGRESS  4.  Patient will improve Stroke Impact Scale-16 by >9 points indicating patient experiencing increased independence with functional mobility tasks and ADLs to improve quality of life and decrease caregiver burden. (Questionnaire to be completed by his  mother) Baseline: 39/80 04/27/2024: 39/80 Goal status: IN PROGRESS  5.  Patient will be modified independent with household level ambulation using LRAD to improve QOL, decrease fall risk, and promote overall increased independence in the home.  Baseline: currently requires constant L HHA to ambulate, providing min A 04/27/2024: gait with walking stick in L UE support at CGA level Goal status: IN PROGRESS  6.  Patient will be able to ascend/descend 4 steps using 1 HR support and no more than SBA to increase his independence with home entry.  Baseline: min A and B HR or B HHA support  04/27/2024: L UE support on HR, carrying walking stick in R UE, and light min A (pt continues to prefer to bump up the stairs instead) Goal status: IN PROGRESS  ASSESSMENT:  CLINICAL IMPRESSION:   WIde based gait continues to appear a bit worse over past 2 sessions, taking advantage of // bars in narrow stance to limit stance width and to promote higher clearance iwth use of stairs. Pt will continue to benefit from skilled therapy to address remaining deficits in order to improve overall QoL and return to PLOF. Patient's condition has the potential to improve in response to therapy. Maximum improvement is yet  to be obtained. The anticipated improvement is attainable and reasonable in a generally predictable time.     OBJECTIVE IMPAIRMENTS: Abnormal gait, decreased activity tolerance, decreased balance, decreased cognition, decreased coordination, decreased endurance, decreased knowledge of use of DME, decreased mobility, difficulty walking, decreased strength, and impaired vision/preception.   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   REHAB POTENTIAL: Good  CLINICAL DECISION MAKING: Evolving/moderate complexity  EVALUATION COMPLEXITY: Moderate  PLAN:  PT FREQUENCY: 1-2x/week  PT DURATION: 12 weeks  PLANNED INTERVENTIONS: 97164- PT Re-evaluation, 97750- Physical Performance Testing, 97110-Therapeutic exercises, 97530- Therapeutic activity, 97112- Neuromuscular re-education, 97535- Self Care, 02859- Manual therapy, [phone]- Gait training, [phone]- Orthotic Initial, [phone]- Orthotic/Prosthetic subsequent, [phone]- Canalith repositioning, [phone]- Electrical stimulation (manual), Patient/Family education, Balance training, Stair training, Taping, Joint mobilization, Spinal mobilization, Vestibular training, Visual/preceptual remediation/compensation, Cognitive remediation, DME instructions, Cryotherapy, Moist heat, and Biofeedback  PLAN FOR NEXT SESSION:  - standing balance  - progression towards more normal BOS (stands with wide base - especially when nervous)  - use of external targets to keep feet in closer BOS (or standing inside brown step that is flipped upside down)  - reaching and head turning in standing (Blaze Pods) - fast gait training - working on increasing his speed - try treadmill (pt educated that this may be a goal because it will also force a more narrow BOS) - gait training with decreased reliance on L HHA  - try to only intermittently use walking stick (pt has become very reliant on the walking stick) - stair navigation training with use of walking stick and 1 HR to simulate home set-up  - pt wants to continue bumping up on his bottom, but ambulatory stair navigation is improving  1:32 PM, 05/16/24 Peggye JAYSON Linear, PT, DPT Physical Therapist - [facility]  Outpatient Physical Therapy- Main Campus [phone]

## 2024-05-18 ENCOUNTER — Ambulatory Visit

## 2024-05-18 DIAGNOSIS — R2681 Unsteadiness on feet: Secondary | ICD-10-CM | POA: Diagnosis not present

## 2024-05-18 DIAGNOSIS — R262 Difficulty in walking, not elsewhere classified: Secondary | ICD-10-CM

## 2024-05-18 DIAGNOSIS — R531 Weakness: Secondary | ICD-10-CM

## 2024-05-18 DIAGNOSIS — R269 Unspecified abnormalities of gait and mobility: Secondary | ICD-10-CM

## 2024-05-18 NOTE — Therapy (Signed)
 OUTPATIENT PHYSICAL THERAPY TREATMENT  Patient Name: Tony Harper MRN: [id number] DOB:1996/02/16, 28 y.o., male Today's Date: 05/18/2024  PCP: Ostwalt, Janna, PA-C REFERRING PROVIDER: Lane Arthea BRAVO, MD   END OF SESSION:   PT End of Session - 05/18/24 1404     Visit Number 14    Number of Visits 24    Date for Recertification  06/06/24    Authorization Type Medicare A&B; Medicaid secondary    Progress Note Due on Visit 20    PT Start Time 1400    PT Stop Time 1440    PT Time Calculation (min) 40 min    Equipment Utilized During Treatment Gait belt    Activity Tolerance Patient tolerated treatment well;No increased pain    Behavior During Therapy [facility] for tasks assessed/performed          Past Medical History:  Diagnosis Date   Cerebral palsy (HCC)    Cysts of right lower eyelid 02/16/2017   Added automatically from request for surgery [id number]     Added automatically from request for surgery [id number]   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)    Neuromuscular disorder [facility])    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 Patient Active Problem List   Diagnosis Date Noted   Gastroesophageal reflux disease without esophagitis 03/02/2024   Tinnitus of both ears 03/02/2024   Enlarged thyroid  gland 03/02/2024   Acute non-recurrent pansinusitis 08/02/2023   Seasonal allergic rhinitis 08/02/2023   Anxiety and depression 04/08/2023   Encounter for therapeutic drug monitoring 10/17/2018   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) 06/23/2018   Cerebral palsy (HCC) 06/23/2018   Seizure disorder (HCC) 01/09/2018    ONSET DATE: ~Gradually over the past 2 years  REFERRING DIAG: R29.898 (ICD-10-CM) - Leg weakness   THERAPY DIAG:   Unsteadiness on feet  Generalized weakness  Difficulty in walking  Abnormality of gait  Rationale for Evaluation and Treatment:  Rehabilitation  SUBJECTIVE:                                                                                                                                                                                             SUBJECTIVE STATEMENT:   Pt doing well today. Arrives with walking stick again. Mom reports she mauy be out medically after 10/28 and need to make a hiatus in scheduling for [place].   PERTINENT HISTORY: Patient's mother reports patient with 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 Pt's mother noticed pt's strength  and balance has declined gradually over the past 2 years. Patient's mother reports pt has R inattention/neglect. Pt's mother reports pt has R hand contracture and requests pt receive OT to address this. Pt's mother reports previously pt could walk independently, although slow with a wide gait and excessive forward trunk flexed posture, but now he is unable to walk without constant L HHA. Pt's mother reports pt previously had a posterior walker, but he fell forward out of it. Mother reports pt also tried a RW and the same thing happened with pt having anterior LOB pushing AD too far out in front of himself (she states same thing happens if patient tries to walk with a buggy in the store). Pt's mother reports when pt navigating steps she provides B HHA rather than him using HRs; otherwise, he will bump up/down the stairs on his bottom.Patient stating he wants to be able to walk without needing HHA from his mother. 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 No pain, just soreness in thighs.   PRECAUTIONS: Fall and Other: hx of seizures (chronic and stable)  WEIGHT BEARING RESTRICTIONS: No  FALLS: Has patient fallen in last 6 months? Yes. Number of falls mother reports pt has frequent falls when he gets up in the middle of the night to go to bathroom by himself  ~1x every 2 weeks  LIVING ENVIRONMENT: Lives with: lives with their family and mother and father Lives in: House/apartment Stairs: Yes: Internal: 5 steps; bilateral but cannot reach both and External: 2 steps; using B HHA from pt's mother Has following equipment at home: shower chair CLOF: Patient scoots on his buttocks to navigate indoor stairs because the handrails are not close enough to reach. Patient using B HHA from his mother to navigate external stairs. Pt requires constant L HHA from his mother to ambulate (otherwise according to pt's mother, he is flexed forward so much that he is almost in quadruped). Mother reports pt can perform stand pivot transfers from EOB to gaming chair mod-I to wheel around in his room in the chair.   PLOF: Independent with household mobility without device, Independent with gait, Independent with transfers, and Needs assistance with ADLs  PATIENT GOALS: walk independently without always needing HHA from his mother, get his R leg as strong his L leg  OBJECTIVE:  Note: Objective measures were completed at evaluation unless otherwise noted.                                                                                                                           TREATMENT DATE: 05/18/2024   -overground AMB c 10lb AW bilat, Left HHA to promote greater gait speed 2:39 -overground AMB c 10lb AW bilat, Left HHA to promote greater gait speed 1:38 -overground AMB c 10lb AW bilat, Left HHA to promote greater gait speed 1:38 -overground AMB c 10lb AW bilat, Left  HHA to promote greater gait speed 1:02  -overground funcitonal itme carry over 15' distance: 9lb AW, 15lb KB, 3kg ball, 1kg ball, each carried twice, ni device, ni Guard assist *blue mat obstacle on floor. Noted fatigue over course of activity with decline in speed and increase in stance width: take s1 standing recovery on final lap x30sec.      PATIENT EDUCATION: Education details: PT POC, findings on  assessment, HEP; importance of multiplanar gait for safe access to community  Person educated: Patient and Mother Education method: Explanation and Handouts Education comprehension: verbalized understanding and needs further education  HOME EXERCISE PROGRAM: Access Code: [email or web address] URL: [email or web address] Date: 03/14/2024 Prepared by: Connell Kiss  Exercises - Step Taps with Counter Support  - 1 x daily - 7 x weekly - 2 sets - 10 reps  GOALS: Goals reviewed with patient? Yes  SHORT TERM GOALS: Target date: 04/25/2024  Patient will be independent with home exercise program to improve strength/mobility for increased functional independence with ADLs and mobility.  Baseline: initiated on 7/22 and pt will require assistance from his mother 04/27/2024: need to progress Goal status: INITIAL  LONG TERM GOALS: Target date: 06/06/2024  Patient will increase Berg Balance score to > 51/56 to demonstrate improved balance and decreased fall risk during functional activities and ADLs.  Baseline: 03/30/2024: 17/56 04/27/2024: 21/56 Goal status: IN PROGRESS  2.  Patient will increase six minute walk test distance to >1064ft, using LRAD with no more than SBA, for progression to more independent community level ambulation, demonstrating improved gait endurance. Baseline: 03/30/2024: 653 feet (199 meters, Avg speed 0.55 m/s) using L HHA providing min A; 05/02/24: 328ft c trekking pole.  Goal status: IN PROGRESS  3.  Patient will increase 10 meter walk test to >1.95m/s using LRAD mod-I as to improve gait speed for better community ambulation and to reduce fall risk. Baseline: 0.6m/s using L HHA for min A  04/27/2024: Average Normal speed: 0.29 m/s using L walking stick with CGA for steadying & Average Fast speed: 0.36 m/s using L walking stick with light min A to facilitate weight shifting ---- VERSUS --- using L HHA providing min A Average Normal speed: 0.54 m/s & Average Fast speed: 0.61 m/s  using Goal status: IN PROGRESS  4.  Patient will improve Stroke Impact Scale-16 by >9 points indicating patient experiencing increased independence with functional mobility tasks and ADLs to improve quality of life and decrease caregiver burden. (Questionnaire to be completed by his mother) Baseline: 39/80 04/27/2024: 39/80 Goal status: IN PROGRESS  5.  Patient will be modified independent with household level ambulation using LRAD to improve QOL, decrease fall risk, and promote overall increased independence in the home.  Baseline: currently requires constant L HHA to ambulate, providing min A 04/27/2024: gait with walking stick in L UE support at CGA level Goal status: IN PROGRESS  6.  Patient will be able to ascend/descend 4 steps using 1 HR support and no more than SBA to increase his independence with home entry.  Baseline: min A and B HR or B HHA support  04/27/2024: L UE support on HR, carrying walking stick in R UE, and light min A (pt continues to prefer to bump up the stairs instead) Goal status: IN PROGRESS  ASSESSMENT:  CLINICAL IMPRESSION:   Trial of higher volume HHA wlaing today to tap into pt's faster walking technique, then combine dwith with high resistance. Pr does remarkably well overall, AMB 4x faster with 10lb weights tan  when he is using his trekking pole. Wide based gait continues to persist overall, however taking advantage of // bars in narrow stance to limit stance width and to promote higher clearance iwth use of stairs remains a useful clinicla tool. Pt will continue to benefit from skilled therapy to address remaining deficits in order to improve overall QoL and return to PLOF. Patient's condition has the potential to improve in response to therapy. Maximum improvement is yet to be obtained. The anticipated improvement is attainable and reasonable in a generally predictable time.     OBJECTIVE IMPAIRMENTS: Abnormal gait, decreased activity tolerance, decreased balance,  decreased cognition, decreased coordination, decreased endurance, decreased knowledge of use of DME, decreased mobility, difficulty walking, decreased strength, and impaired vision/preception.   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   REHAB POTENTIAL: Good  CLINICAL DECISION MAKING: Evolving/moderate complexity  EVALUATION COMPLEXITY: Moderate  PLAN:  PT FREQUENCY: 1-2x/week  PT DURATION: 12 weeks  PLANNED INTERVENTIONS: 97164- PT Re-evaluation, 97750- Physical Performance Testing, 97110-Therapeutic exercises, 97530- Therapeutic activity, 97112- Neuromuscular re-education, 97535- Self Care, 02859- Manual therapy, [phone]- Gait training, [phone]- Orthotic Initial, [phone]- Orthotic/Prosthetic subsequent, [phone]- Canalith repositioning, [phone]- Electrical stimulation (manual), Patient/Family education, Balance training, Stair training, Taping, Joint mobilization, Spinal mobilization, Vestibular training, Visual/preceptual remediation/compensation, Cognitive remediation, DME instructions, Cryotherapy, Moist heat, and Biofeedback  PLAN FOR NEXT SESSION:  - standing balance  - progression towards more normal BOS (stands with wide base - especially when nervous)  - use of external targets to keep feet in closer BOS (or standing inside brown step that is flipped upside down)  - reaching and head turning in standing (Blaze Pods) - fast gait training - working on increasing his speed - try  treadmill (pt educated that this may be a goal because it will also force a more narrow BOS) - gait training with decreased reliance on L HHA  - try to only intermittently use walking stick (pt has become very reliant on the walking stick) - stair navigation training with use of walking stick and 1 HR to simulate home set-up  - pt wants to continue bumping up on his bottom, but ambulatory stair navigation is improving  2:05 PM, 05/18/24 Peggye JAYSON Linear, PT, DPT Physical Therapist - [facility]  Outpatient Physical Therapy- Main Campus [phone]

## 2024-05-23 ENCOUNTER — Ambulatory Visit: Admitting: Physical Therapy

## 2024-05-23 ENCOUNTER — Telehealth: Payer: Self-pay | Admitting: Physical Therapy

## 2024-05-23 NOTE — Telephone Encounter (Signed)
 Pt's mother contacted via telephone and author left voice mail informing of missed appointment and informed pt of future PT appointment date and time.   Massie Dollar PT, DPT  Physical Therapist - [place]  [facility]  1:49 PM 05/23/24

## 2024-05-23 NOTE — Telephone Encounter (Signed)
 Pt contacted via telephone to attempt to provide voice mail informing of missed appointment and informed pt of future PT appointment date and time, but received message that the Phone number is not in service. Unable to leave voicemail   Massie Dollar PT, DPT  Physical Therapist - [facility]  1:34 PM 05/23/24

## 2024-05-25 ENCOUNTER — Telehealth: Payer: Self-pay

## 2024-05-25 ENCOUNTER — Ambulatory Visit: Admitting: Physical Therapy

## 2024-05-25 ENCOUNTER — Ambulatory Visit: Attending: Neurology

## 2024-05-25 DIAGNOSIS — R2681 Unsteadiness on feet: Secondary | ICD-10-CM | POA: Diagnosis present

## 2024-05-25 DIAGNOSIS — M6281 Muscle weakness (generalized): Secondary | ICD-10-CM | POA: Insufficient documentation

## 2024-05-25 DIAGNOSIS — R269 Unspecified abnormalities of gait and mobility: Secondary | ICD-10-CM

## 2024-05-25 DIAGNOSIS — R278 Other lack of coordination: Secondary | ICD-10-CM | POA: Insufficient documentation

## 2024-05-25 DIAGNOSIS — R262 Difficulty in walking, not elsewhere classified: Secondary | ICD-10-CM | POA: Diagnosis present

## 2024-05-25 DIAGNOSIS — R531 Weakness: Secondary | ICD-10-CM | POA: Insufficient documentation

## 2024-05-25 NOTE — Therapy (Signed)
 OUTPATIENT PHYSICAL THERAPY TREATMENT  Patient Name: Tony Harper MRN: [id number] DOB:[birth year], 28 y.o., male Today's Date: 05/25/2024  PCP: Ostwalt, Janna, PA-C REFERRING PROVIDER: Lane Arthea BRAVO, MD   END OF SESSION:   PT End of Session - 05/25/24 1538     Visit Number 15    Number of Visits 24    Date for Recertification  06/06/24    Authorization Type Medicare A&B; Medicaid secondary    Progress Note Due on Visit 20    PT Start Time 1537    PT Stop Time 1619    PT Time Calculation (min) 42 min    Equipment Utilized During Treatment Gait belt    Activity Tolerance Patient tolerated treatment well;No increased pain    Behavior During Therapy [facility] for tasks assessed/performed           Past Medical History:  Diagnosis Date   Cerebral palsy (HCC)    Cysts of right lower eyelid 02/16/2017   Added automatically from request for surgery [id number]     Added automatically from request for surgery [id number]   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)    Neuromuscular disorder [facility])    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 Patient Active Problem List   Diagnosis Date Noted   Gastroesophageal reflux disease without esophagitis 03/02/2024   Tinnitus of both ears 03/02/2024   Enlarged thyroid  gland 03/02/2024   Acute non-recurrent pansinusitis 08/02/2023   Seasonal allergic rhinitis 08/02/2023   Anxiety and depression 04/08/2023   Encounter for therapeutic drug monitoring 10/17/2018   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) 06/23/2018   Cerebral palsy (HCC) 06/23/2018   Seizure disorder (HCC) 01/09/2018    ONSET DATE: ~Gradually over the past 2 years  REFERRING DIAG: R29.898 (ICD-10-CM) - Leg weakness   THERAPY DIAG:   Unsteadiness on feet  Generalized weakness  Difficulty in walking  Abnormality of gait  Rationale for Evaluation and Treatment:  Rehabilitation  SUBJECTIVE:                                                                                                                                                                                             SUBJECTIVE STATEMENT:    Pt reports he has been doing good. Pt's dad accompanying him today. Neither with significant updates. Pt without walking stick today.  Per prior session: Pt's mom reports she mauy be out medically after 10/28 and need to make a hiatus in scheduling for [place].   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 Pt's mother noticed pt's strength and balance has declined gradually over the past 2 years. Patient's mother reports pt has R inattention/neglect. Pt's mother reports pt has R hand contracture and requests pt receive OT to address this. Pt's mother reports previously pt could walk independently, although slow with a wide gait and excessive forward trunk flexed posture, but now he is unable to walk without constant L HHA. Pt's mother reports pt previously had a posterior walker, but he fell forward out of it. Mother reports pt also tried a RW and the same thing happened with pt having anterior LOB pushing AD too far out in front of himself (she states same thing happens if patient tries to walk with a buggy in the store). Pt's mother reports when pt navigating steps she provides B HHA rather than him using HRs; otherwise, he will bump up/down the stairs on his bottom.Patient stating he wants to be able to walk without needing HHA from his mother. 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 No pain, just soreness in thighs.   PRECAUTIONS: Fall and Other: hx of seizures (chronic and stable)  WEIGHT BEARING RESTRICTIONS: No  FALLS: Has patient fallen in last 6 months? Yes. Number of falls  mother reports pt has frequent falls when he gets up in the middle of the night to go to bathroom by himself ~1x every 2 weeks  LIVING ENVIRONMENT: Lives with: lives with their family and mother and father Lives in: House/apartment Stairs: Yes: Internal: 5 steps; bilateral but cannot reach both and External: 2 steps; using B HHA from pt's mother Has following equipment at home: shower chair CLOF: Patient scoots on his buttocks to navigate indoor stairs because the handrails are not close enough to reach. Patient using B HHA from his mother to navigate external stairs. Pt requires constant L HHA from his mother to ambulate (otherwise according to pt's mother, he is flexed forward so much that he is almost in quadruped). Mother reports pt can perform stand pivot transfers from EOB to gaming chair mod-I to wheel around in his room in the chair.   PLOF: Independent with household mobility without device, Independent with gait, Independent with transfers, and Needs assistance with ADLs  PATIENT GOALS: walk independently without always needing HHA from his mother, get his R leg as strong his L leg  OBJECTIVE:  Note: Objective measures were completed at evaluation unless otherwise noted.                                                                                                                           TREATMENT DATE: 05/25/2024    Gait into therapy clinic using L HHA from thearpist providing light min A for balance. Pt continues to demo wide BOS and decreased step lengths, although improved since last  time seen by this therapist.  Educated pt on goal of gait training on treadmill to progress increasing gait speed, also treadmill will require pt to ambulate with more normal width due to size of the track.  Gait training to/from treadmill using R HHA for balance.   Standing with B UE support on litegait handle, donned harness. Pt stepped on/off treadmill using B UE support on litegait handles  with light facilitation from therapist for weight shifting.  Gait training on treadmill the following trials using B UE support throughout: Progressed to 0.47mph for 66min14sec, 242ft Pt demos decreased step lengths bilaterally, landing on midfoot/forefoot at initial contact Therapist providing external target cue to increase step lengths, which pt could improve for ~1-2 steps before requiring another cue Progressed to 1.59mph for 23min30sec totaling 156ft Pt reports his B UEs were sore afterwards from holding on so strongly to the handles due to fear of falling, despite use of harness for safety and therapist educating on harness  Therapist providing manual facilitation for weight shifting onto stance leg Pt demos improved BOS with this facilitation as well as increased step lengths Overall pt with increased comfort ambulating on treadmill during 2nd trial, resulting in improved step length, foot clearance, and foot landing on initial contact   Donned 10lb AWs to each LE Performed 4x ~52ft timed gait trials with pt being educated on goal to decrease his time on each subsequent trial: pt requires 54sec, 43sec, 47sec, and 42 seconds to walk each distance Using L HHA for balance throughout  Therapist facilitating wt shift onto stance limb during final trial Pt does continue to demo wide BOS when ambulating with AWs  Pt continues to benefit from manual facilitation for weight shifting fully and longer onto stance limbs - would benefit from problem solving how to help him achieve this weight shift more independently, which AWs help with, but not fully.   Pt continues to rely heavily on L HHA when standing and continues to demo high fear of falling with startle response any time therapist changes/moves L HHA throughout session.  Removed AWs Gait training ~43ft out of therapy clinic with L HHA providing min A for balance - pt continues to demo overall improving gait speed compared to last time seen  by this therapist, although maintains wider BOS and increased fear of falling without L UE support.    PATIENT EDUCATION: Education details: PT POC, findings on assessment, HEP; importance of multiplanar gait for safe access to community  Person educated: Patient and Mother Education method: Explanation and Handouts Education comprehension: verbalized understanding and needs further education  HOME EXERCISE PROGRAM: Access Code: [email or web address] URL: [email or web address] Date: 03/14/2024 Prepared by: Connell Kiss  Exercises - Step Taps with Counter Support  - 1 x daily - 7 x weekly - 2 sets - 10 reps  GOALS: Goals reviewed with patient? Yes  SHORT TERM GOALS: Target date: 04/25/2024  Patient will be independent with home exercise program to improve strength/mobility for increased functional independence with ADLs and mobility.  Baseline: initiated on 7/22 and pt will require assistance from his mother 04/27/2024: need to progress Goal status: INITIAL  LONG TERM GOALS: Target date: 06/06/2024  Patient will increase Berg Balance score to > 51/56 to demonstrate improved balance and decreased fall risk during functional activities and ADLs.  Baseline: 03/30/2024: 17/56 04/27/2024: 21/56 Goal status: IN PROGRESS  2.  Patient will increase six minute walk test distance to >1044ft, using LRAD with no more than SBA, for progression  to more independent community level ambulation, demonstrating improved gait endurance. Baseline: 03/30/2024: 653 feet (199 meters, Avg speed 0.55 m/s) using L HHA providing min A; 05/02/24: 339ft c trekking pole.  Goal status: IN PROGRESS  3.  Patient will increase 10 meter walk test to >1.19m/s using LRAD mod-I as to improve gait speed for better community ambulation and to reduce fall risk. Baseline: 0.54m/s using L HHA for min A  04/27/2024: Average Normal speed: 0.29 m/s using L walking stick with CGA for steadying & Average Fast speed: 0.36 m/s using L  walking stick with light min A to facilitate weight shifting ---- VERSUS --- using L HHA providing min A Average Normal speed: 0.54 m/s & Average Fast speed: 0.61 m/s using Goal status: IN PROGRESS  4.  Patient will improve Stroke Impact Scale-16 by >9 points indicating patient experiencing increased independence with functional mobility tasks and ADLs to improve quality of life and decrease caregiver burden. (Questionnaire to be completed by his mother) Baseline: 39/80 04/27/2024: 39/80 Goal status: IN PROGRESS  5.  Patient will be modified independent with household level ambulation using LRAD to improve QOL, decrease fall risk, and promote overall increased independence in the home.  Baseline: currently requires constant L HHA to ambulate, providing min A 04/27/2024: gait with walking stick in L UE support at CGA level Goal status: IN PROGRESS  6.  Patient will be able to ascend/descend 4 steps using 1 HR support and no more than SBA to increase his independence with home entry.  Baseline: min A and B HR or B HHA support  04/27/2024: L UE support on HR, carrying walking stick in R UE, and light min A (pt continues to prefer to bump up the stairs instead) Goal status: IN PROGRESS  ASSESSMENT:  CLINICAL IMPRESSION:   Therapy session focused on gait training on treadmill to promote increased gait speed while also requiring more normal gait width due to size of treadmill. Patient's comfort with the treadmill was and will continue to increase with repeated exposure because despite use of harness for safety, pt reported fear of falling and pt maintaining strong grasp on handles due to this. Therapist providing manual facilitation for wt shifting onto stance limbs throughout gait and this significantly improves pt's step lengths resulting in increased gait speed; also, on the treadmill it resulted in significant improvement for normal gait width. Pt will benefit from further gait training on treadmill  with use of litegait harness as well as continued dynamic balance tasks without reliance on L HHA. Pt will continue to benefit from skilled therapy to address remaining deficits in order to improve overall QoL and return to PLOF.   OBJECTIVE IMPAIRMENTS: Abnormal gait, decreased activity tolerance, decreased balance, decreased cognition, decreased coordination, decreased endurance, decreased knowledge of use of DME, decreased mobility, difficulty walking, decreased strength, and impaired vision/preception.   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   REHAB POTENTIAL: Good  CLINICAL DECISION MAKING: Evolving/moderate complexity  EVALUATION COMPLEXITY: Moderate  PLAN:  PT FREQUENCY: 1-2x/week  PT DURATION: 12 weeks  PLANNED INTERVENTIONS: 97164- PT Re-evaluation, 97750- Physical Performance Testing, 97110-Therapeutic exercises, 97530- Therapeutic activity, [id number]- Neuromuscular  re-education, [phone]- Self Care, 02859- Manual therapy, [phone]- Gait training, [phone]- Orthotic Initial, [phone]- Orthotic/Prosthetic subsequent, [phone]- Canalith repositioning, [phone]- Electrical stimulation (manual), Patient/Family education, Balance training, Stair training, Taping, Joint mobilization, Spinal mobilization, Vestibular training, Visual/preceptual remediation/compensation, Cognitive remediation, DME instructions, Cryotherapy, Moist heat, and Biofeedback  PLAN FOR NEXT SESSION:  - standing  balance  - progression towards more normal BOS (stands with wide base - especially when nervous)  - use of external targets to keep feet in closer BOS (or standing inside brown step that is flipped upside down)  - reaching and head turning in standing ([employer]) - fast gait training - working on increasing his speed - continue gait training on treadmill with litegait harness - gait training with decreased reliance on L HHA  - try to only intermittently use walking stick (pt has become very reliant on the walking stick) - stair navigation training with use of walking stick and 1 HR to simulate home set-up  - pt wants to continue bumping up on his bottom, but ambulatory stair navigation is improving   Phoenicia Pirie, PT, DPT, NCS, CSRS Physical Therapist - [facility]  [facility]  4:23 PM 05/25/24

## 2024-05-25 NOTE — Telephone Encounter (Signed)
 Pt did not arrive for scheduled appointment. No telephone call nor message preceeded this absence. Author attempted to contact pt via telephone number listed in chart. Mom reports their sheet at home indicated 3:30 appointment and that pt likely is enroute to clinic for 3:30. After review and discussion with staff, there does appear to be an openin gon the schedule that can accommodate pt at 3:30 if he arrives. Mom, support rep, clinician all notified.   3:11 PM, 05/25/24 Peggye JAYSON Linear, PT, DPT Physical Therapist - [facility]  Outpatient Physical Therapy- Main Campus [phone]

## 2024-05-30 ENCOUNTER — Ambulatory Visit

## 2024-05-30 DIAGNOSIS — M6281 Muscle weakness (generalized): Secondary | ICD-10-CM | POA: Diagnosis not present

## 2024-05-30 DIAGNOSIS — R262 Difficulty in walking, not elsewhere classified: Secondary | ICD-10-CM

## 2024-05-30 DIAGNOSIS — R269 Unspecified abnormalities of gait and mobility: Secondary | ICD-10-CM

## 2024-05-30 DIAGNOSIS — R2681 Unsteadiness on feet: Secondary | ICD-10-CM

## 2024-05-30 DIAGNOSIS — R531 Weakness: Secondary | ICD-10-CM

## 2024-05-30 NOTE — Therapy (Signed)
 OUTPATIENT PHYSICAL THERAPY TREATMENT  Patient Name: Tony Harper MRN: [id number] DOB:July 31, 1996, 28 y.o., male Today's Date: 05/30/2024  PCP: Ostwalt, Janna, PA-C REFERRING PROVIDER: Lane Arthea BRAVO, MD   END OF SESSION:   PT End of Session - 05/30/24 1407     Visit Number 16    Number of Visits 24    Date for Recertification  06/06/24    Authorization Type Medicare A&B; Medicaid secondary    Progress Note Due on Visit 20    PT Start Time 1405    PT Stop Time 1445    PT Time Calculation (min) 40 min    Equipment Utilized During Treatment Gait belt    Activity Tolerance Patient tolerated treatment well;No increased pain    Behavior During Therapy [facility] for tasks assessed/performed           Past Medical History:  Diagnosis Date   Cerebral palsy (HCC)    Cysts of right lower eyelid 02/16/2017   Added automatically from request for surgery [id number]     Added automatically from request for surgery [id number]   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)    Neuromuscular disorder [facility])    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 Patient Active Problem List   Diagnosis Date Noted   Gastroesophageal reflux disease without esophagitis 03/02/2024   Tinnitus of both ears 03/02/2024   Enlarged thyroid  gland 03/02/2024   Acute non-recurrent pansinusitis 08/02/2023   Seasonal allergic rhinitis 08/02/2023   Anxiety and depression 04/08/2023   Encounter for therapeutic drug monitoring 10/17/2018   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) 06/23/2018   Cerebral palsy (HCC) 06/23/2018   Seizure disorder (HCC) 01/09/2018    ONSET DATE: ~Gradually over the past 2 years  REFERRING DIAG: R29.898 (ICD-10-CM) - Leg weakness   THERAPY DIAG:   Unsteadiness on feet  Generalized weakness  Difficulty in walking  Abnormality of gait  Rationale for Evaluation and Treatment:  Rehabilitation  SUBJECTIVE:                                                                                                                                                                                             SUBJECTIVE STATEMENT:   Pt doing well. No major updates. 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 Pt's mother noticed pt's strength and balance has declined gradually over the past 2 years. Patient's mother reports pt has  R inattention/neglect. Pt's mother reports pt has R hand contracture and requests pt receive OT to address this. Pt's mother reports previously pt could walk independently, although slow with a wide gait and excessive forward trunk flexed posture, but now he is unable to walk without constant L HHA. Pt's mother reports pt previously had a posterior walker, but he fell forward out of it. Mother reports pt also tried a RW and the same thing happened with pt having anterior LOB pushing AD too far out in front of himself (she states same thing happens if patient tries to walk with a buggy in the store). Pt's mother reports when pt navigating steps she provides B HHA rather than him using HRs; otherwise, he will bump up/down the stairs on his bottom.Patient stating he wants to be able to walk without needing HHA from his mother. 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 No pain  PRECAUTIONS: Fall and Other: hx of seizures (chronic and stable)  WEIGHT BEARING RESTRICTIONS: No  FALLS: Has patient fallen in last 6 months? Yes. Number of falls mother reports pt has frequent falls when he gets up in the middle of the night to go to bathroom by himself ~1x every 2 weeks  LIVING ENVIRONMENT: Lives with: lives with their family and mother and father Lives in:  House/apartment Stairs: Yes: Internal: 5 steps; bilateral but cannot reach both and External: 2 steps; using B HHA from pt's mother Has following equipment at home: shower chair CLOF: Patient scoots on his buttocks to navigate indoor stairs because the handrails are not close enough to reach. Patient using B HHA from his mother to navigate external stairs. Pt requires constant L HHA from his mother to ambulate (otherwise according to pt's mother, he is flexed forward so much that he is almost in quadruped). Mother reports pt can perform stand pivot transfers from EOB to gaming chair mod-I to wheel around in his room in the chair.   PLOF: Independent with household mobility without device, Independent with gait, Independent with transfers, and Needs assistance with ADLs  PATIENT GOALS: walk independently without always needing HHA from his mother, get his R leg as strong his L leg  OBJECTIVE:  Note: Objective measures were completed at evaluation unless otherwise noted.                                                                                                                           TREATMENT DATE: 05/30/2024   -overground AMB cx 10lb AW bilat, LUE HHA, cues for fast performance  -seated recovery -overground AMB cx 10lb AW bilat, LUE HHA, cues for fast performance (65m36s) -seated recovery  -overground funcitonal itme carry over 15' distance: 9lb AW, 15lb KB, 3kg ball, 1kg ball, each carried twice, ni device, ni Guard assist *blue mat obstacle on floor. Noted fatigue over course of activity with decline in speed  and increase in stance width: take s1 standing recovery on final lap x30sec.    PATIENT EDUCATION: Education details: PT POC, findings on assessment, HEP; importance of multiplanar gait for safe access to community  Person educated: Patient and Mother Education method: Explanation and Handouts Education comprehension: verbalized understanding and needs further education  HOME  EXERCISE PROGRAM: Access Code: [email or web address] URL: [email or web address] Date: 03/14/2024 Prepared by: Connell Kiss  Exercises - Step Taps with Counter Support  - 1 x daily - 7 x weekly - 2 sets - 10 reps  GOALS: Goals reviewed with patient? Yes  SHORT TERM GOALS: Target date: 04/25/2024  Patient will be independent with home exercise program to improve strength/mobility for increased functional independence with ADLs and mobility.  Baseline: initiated on 7/22 and pt will require assistance from his mother 04/27/2024: need to progress Goal status: INITIAL  LONG TERM GOALS: Target date: 06/06/2024  Patient will increase Berg Balance score to > 51/56 to demonstrate improved balance and decreased fall risk during functional activities and ADLs.  Baseline: 03/30/2024: 17/56 04/27/2024: 21/56 Goal status: IN PROGRESS  2.  Patient will increase six minute walk test distance to >1025ft, using LRAD with no more than SBA, for progression to more independent community level ambulation, demonstrating improved gait endurance. Baseline: 03/30/2024: 653 feet (199 meters, Avg speed 0.55 m/s) using L HHA providing min A; 05/02/24: 394ft c trekking pole.  Goal status: IN PROGRESS  3.  Patient will increase 10 meter walk test to >1.88m/s using LRAD mod-I as to improve gait speed for better community ambulation and to reduce fall risk. Baseline: 0.32m/s using L HHA for min A  04/27/2024: Average Normal speed: 0.29 m/s using L walking stick with CGA for steadying & Average Fast speed: 0.36 m/s using L walking stick with light min A to facilitate weight shifting ---- VERSUS --- using L HHA providing min A Average Normal speed: 0.54 m/s & Average Fast speed: 0.61 m/s using Goal status: IN PROGRESS  4.  Patient will improve Stroke Impact Scale-16 by >9 points indicating patient experiencing increased independence with functional mobility tasks and ADLs to improve quality of life and decrease caregiver burden.  (Questionnaire to be completed by his mother) Baseline: 39/80 04/27/2024: 39/80 Goal status: IN PROGRESS  5.  Patient will be modified independent with household level ambulation using LRAD to improve QOL, decrease fall risk, and promote overall increased independence in the home.  Baseline: currently requires constant L HHA to ambulate, providing min A 04/27/2024: gait with walking stick in L UE support at CGA level Goal status: IN PROGRESS  6.  Patient will be able to ascend/descend 4 steps using 1 HR support and no more than SBA to increase his independence with home entry.  Baseline: min A and B HR or B HHA support  04/27/2024: L UE support on HR, carrying walking stick in R UE, and light min A (pt continues to prefer to bump up the stairs instead) Goal status: IN PROGRESS  ASSESSMENT:  CLINICAL IMPRESSION:   Continued with overground AMB, high intensity gait training, cues to take greater strifdes and reduce stance width. Pt reports feeling generally good with this but reports some soreness previously afterward. Pt will continue to benefit from skilled therapy to address remaining deficits in order to improve overall QoL and return to PLOF.   OBJECTIVE IMPAIRMENTS: Abnormal gait, decreased activity tolerance, decreased balance, decreased cognition, decreased coordination, decreased endurance, decreased knowledge of use of DME, decreased mobility, difficulty walking, decreased  strength, and impaired vision/preception.   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   REHAB POTENTIAL: Good  CLINICAL DECISION MAKING: Evolving/moderate complexity  EVALUATION COMPLEXITY: Moderate  PLAN:  PT FREQUENCY: 1-2x/week  PT DURATION: 12 weeks  PLANNED INTERVENTIONS: 97164- PT Re-evaluation, 97750- Physical Performance Testing, 97110-Therapeutic exercises, 97530- Therapeutic activity, 97112- Neuromuscular re-education, 97535- Self Care, 02859- Manual therapy, [phone]- Gait training, [phone]- Orthotic Initial, [phone]- Orthotic/Prosthetic subsequent, [phone]- Canalith repositioning, [phone]- Electrical stimulation (manual), Patient/Family education, Balance training, Stair training, Taping, Joint mobilization, Spinal mobilization, Vestibular training, Visual/preceptual remediation/compensation, Cognitive remediation, DME instructions, Cryotherapy, Moist heat, and Biofeedback  PLAN FOR NEXT SESSION:   2:17 PM, 05/30/24 Peggye JAYSON Linear, PT, DPT Physical Therapist - [facility]  Outpatient Physical Therapy- Main Campus [phone]

## 2024-06-01 ENCOUNTER — Ambulatory Visit

## 2024-06-01 DIAGNOSIS — M6281 Muscle weakness (generalized): Secondary | ICD-10-CM | POA: Diagnosis not present

## 2024-06-01 DIAGNOSIS — R278 Other lack of coordination: Secondary | ICD-10-CM

## 2024-06-01 NOTE — Progress Notes (Deleted)
 Established patient visit  Patient: Tony Harper   DOB: 05/13/1996   28 y.o. Male  MRN: [id number] Visit Date: 06/02/2024  Today's healthcare provider: Jolynn Spencer, PA-C   No chief complaint on file.  Subjective       Discussed the use of AI scribe software for clinical note transcription with the patient, who gave verbal consent to proceed.  History of Present Illness        03/02/2024   11:42 AM 04/06/2023    1:14 PM 12/28/2022    2:33 PM  Depression screen PHQ 2/9  Decreased Interest 0 3 0  Down, Depressed, Hopeless 0 3 0  PHQ - 2 Score 0 6 0  Altered sleeping 0 3   Tired, decreased energy 3 1   Change in appetite 1 3   Feeling bad or failure about yourself  0 3   Trouble concentrating 0 0   Moving slowly or fidgety/restless 0 0   Suicidal thoughts 0 1   PHQ-9 Score 4 17   Difficult doing work/chores Not difficult at all        03/02/2024   11:42 AM  GAD 7 : Generalized Anxiety Score  Nervous, Anxious, on Edge 0  Control/stop worrying 0  Worry too much - different things 0  Trouble relaxing 0  Restless 0  Easily annoyed or irritable 1  Afraid - awful might happen 0  Total GAD 7 Score 1  Anxiety Difficulty Not difficult at all    Medications: Outpatient Medications Prior to Visit  Medication Sig  . benzonatate  (TESSALON ) 200 MG capsule Take 1 capsule (200 mg total) by mouth 2 (two) times daily as needed for cough.  . carbamazepine  (CARBATROL ) 200 MG 12 hr capsule TAKE 1 CAPSULE BY MOUTH TWICE A DAY  . chlorpheniramine-HYDROcodone (TUSSIONEX) 10-8 MG/5ML Take 5 mLs by mouth at bedtime as needed for cough.  . phenytoin  (DILANTIN ) 100 MG ER capsule TAKE 2 CAPSULES BY MOUTH EVERY MORNING AND 1 CAPSULE EVERY NIGHT AT BEDTIME   No facility-administered medications prior to visit.    Review of Systems  All other systems reviewed and are negative.  All negative Except see HPI   {Insert previous labs (optional):23779} {See past labs  Heme  Chem   Endocrine  Serology  Results Review (optional):1}   Objective    There were no vitals taken for this visit. {Insert last BP/Wt (optional):23777}{See vitals history (optional):1}   Physical Exam Vitals reviewed.  Constitutional:      General: He is not in acute distress.    Appearance: Normal appearance. He is not diaphoretic.  HENT:     Head: Normocephalic and atraumatic.  Eyes:     General: No scleral icterus.    Conjunctiva/sclera: Conjunctivae normal.  Cardiovascular:     Rate and Rhythm: Normal rate and regular rhythm.     Pulses: Normal pulses.     Heart sounds: Normal heart sounds. No murmur heard. Pulmonary:     Effort: Pulmonary effort is normal. No respiratory distress.     Breath sounds: Normal breath sounds. No wheezing or rhonchi.  Musculoskeletal:     Cervical back: Neck supple.     Right lower leg: No edema.     Left lower leg: No edema.  Lymphadenopathy:     Cervical: No cervical adenopathy.  Skin:    General: Skin is warm and dry.     Findings: No rash.  Neurological:     Mental Status: He is alert and  oriented to person, place, and time. Mental status is at baseline.  Psychiatric:        Mood and Affect: Mood normal.        Behavior: Behavior normal.     No results found for any visits on 06/02/24.      Assessment and Plan Assessment & Plan     No orders of the defined types were placed in this encounter.   No follow-ups on file.   The patient was advised to call back or seek an in-person evaluation if the symptoms worsen or if the condition fails to improve as anticipated.  I discussed the assessment and treatment plan with the patient. The patient was provided an opportunity to ask questions and all were answered. The patient agreed with the plan and demonstrated an understanding of the instructions.  I, Brittain Smithey, PA-C have reviewed all documentation for this visit. The documentation on 06/02/2024  for the exam, diagnosis,  procedures, and orders are all accurate and complete.  Jolynn Spencer, [facility], MMS [facility] [phone] (phone) [phone] (fax)  [facility]

## 2024-06-01 NOTE — Therapy (Unsigned)
 OUTPATIENT OCCUPATIONAL THERAPY NEURO EVALUATION  Patient Name: Tony Harper MRN: [id number] DOB:[birth year], 28 y.o., male Today's Date: 06/01/2024  PCP: Janna Pstwalt, Janna, PA-C REFERRING PROVIDER: Dr. 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 Patient Active Problem List   Diagnosis Date Noted   Gastroesophageal reflux disease without esophagitis 03/02/2024   Tinnitus of both ears 03/02/2024   Enlarged thyroid  gland 03/02/2024   Acute non-recurrent pansinusitis 08/02/2023   Seasonal allergic rhinitis 08/02/2023   Anxiety and depression 04/08/2023   Encounter for therapeutic drug monitoring 10/17/2018   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) 06/23/2018   Cerebral palsy (HCC) 06/23/2018   Seizure disorder (HCC) 01/09/2018   ONSET DATE: Birth  REFERRING DIAG: ***  THERAPY DIAG:  Muscle weakness (generalized)  Other lack of coordination  Rationale for Evaluation and Treatment: Rehabilitation  SUBJECTIVE:   SUBJECTIVE STATEMENT: *** Pt accompanied by: family member, Mom, [name]  PERTINENT HISTORY: pt stopped school when he was 89 d/t bullying, home with parents, OT/PT/SLP all through school.  No therapy since then.  Dr. Farrow prompted therapy.   PRECAUTIONS: {Therapy precautions:24002}  WEIGHT BEARING RESTRICTIONS: {Yes ***/No:24003}  PAIN:  Are you having pain? {OPRCPAIN:27236}  FALLS: Has patient  fallen in last 6 months? Yes. Number of falls 3-4, no injuries  LIVING ENVIRONMENT: Mom has upcoming surgery meeting with surgeon end of this month  Lives with: lives with their family, pt lives with parents  Lives in: single level home with 6 steps  Stairs: {opstairs:27293} scoots up stairs up buttocks because he can't hold both Has following equipment at home: walk in shower with shower chair, grab bars, walking stick but doesn't use much   PLOF: {PLOF:24004}  PATIENT GOALS: Per pt, Get a job, improve self care skills, per mom   OBJECTIVE:  Note: Objective measures were completed at Evaluation unless otherwise noted.  HAND DOMINANCE: Left  ADLs: Able to stay home alone 2-3 hours  Overall ADLs: *** Transfers/ambulation related to ADLs: hand held assist to walk Eating: set up for cutting, uses fork/spoon in L hand  Grooming: mom brushes teeth  UB Dressing: total assist to don, able to remove clothing LB Dressing: total assist to don, able to remove clothing; mom reports pt can manage clothing fasteners but requires help with new jeans (tight buttons) Toileting: total assist for peri care, occasional dribbling accidents Bathing: total assist and pt sits on shower chair, pt helps with the rinsing Tub Shower transfers: min A/hand held  Equipment: hand held shower hose   IADLs: total assist  Shopping: *** Light housekeeping: *** Meal Prep: *** Community mobility: *** Medication management: *** Financial management: *** Handwriting: {OTWRITTENEXPRESSION:25361}  MOBILITY STATUS: {OTMOBILITY:25360}  POSTURE COMMENTS:  {posture:25561} Sitting balance: {sitting balance:25483}  ACTIVITY TOLERANCE: Activity tolerance: ***  FUNCTIONAL OUTCOME MEASURES: {  OTFUNCTIONALMEASURES:27238}  UPPER EXTREMITY ROM:  elbow -30, R shoulder flexion 140, L 180 flex   Active ROM Right eval Left eval  Shoulder flexion    Shoulder abduction    Shoulder adduction    Shoulder extension     Shoulder internal rotation    Shoulder external rotation    Elbow flexion    Elbow extension    Wrist flexion    Wrist extension    Wrist ulnar deviation    Wrist radial deviation    Wrist pronation    Wrist supination    (Blank rows = not tested)  UPPER EXTREMITY MMT:     MMT Right eval Left eval  Shoulder flexion    Shoulder abduction    Shoulder adduction    Shoulder extension    Shoulder internal rotation    Shoulder external rotation    Middle trapezius    Lower trapezius    Elbow flexion    Elbow extension    Wrist flexion    Wrist extension    Wrist ulnar deviation    Wrist radial deviation    Wrist pronation    Wrist supination    (Blank rows = not tested)  HAND FUNCTION: Grip strength: Right: 34 lbs; Left: 64 lbs, Lateral pinch: Right: 0 lbs, Left: 10 lbs, and 3 point pinch: Right: 0 lbs, Left: 13 lbs  COORDINATION: Finger Nose Finger test: *** 9 Hole Peg test: Right: 3 min 27  sec; Left: 1 min 7 sec  SENSATION: Light touch: {WFL/IMPARIED:27018}  EDEMA: ***  MUSCLE TONE: {UETONE:25567}  COGNITION: Overall cognitive status: Impaired  VISION: Subjective report: *** Baseline vision: {OTBASELINEVISION:25363} Visual history: {OTVISUALHISTORY:25364}  VISION ASSESSMENT: {visionassessment:27231}  Patient has difficulty with following activities due to following visual impairments: ***  PERCEPTION: {Perception:25564}  PRAXIS: {Praxis:25565}  OBSERVATIONS: ***                                                                                                                             TREATMENT DATE: ***         PATIENT EDUCATION: Education details: *** Person educated: {Person educated:25204} Education method: {Education Method:25205} Education comprehension: {Education Comprehension:25206}  HOME EXERCISE PROGRAM: ***   GOALS: Goals reviewed with patient? {yes/no:20286}  SHORT TERM GOALS: Target date:  ***  *** Baseline: Goal status: INITIAL  2.  *** Baseline:  Goal status: INITIAL  3.  *** Baseline:  Goal status: INITIAL  4.  *** Baseline:  Goal status: INITIAL  5.  *** Baseline:  Goal status: INITIAL  6.  *** Baseline:  Goal status: INITIAL  LONG TERM GOALS: Target date: ***  *** Baseline:  Goal status: INITIAL  2.  *** Baseline:  Goal status: INITIAL  3.  *** Baseline:  Goal status: INITIAL  4.  *** Baseline:  Goal status: INITIAL  5.  *** Baseline:  Goal status: INITIAL  6.  *** Baseline:  Goal status: INITIAL  ASSESSMENT:  CLINICAL  IMPRESSION: Patient is a *** y.o. *** who was seen today for occupational therapy evaluation for ***.   PERFORMANCE DEFICITS: in functional skills including {OT physical skills:25468}, cognitive skills including {OT cognitive skills:25469}, and psychosocial skills including {OT psychosocial skills:25470}.   IMPAIRMENTS: are limiting patient from {OT performance deficits:25471}.   CO-MORBIDITIES: {Comorbidities:25485} that affects occupational performance. Patient will benefit from skilled OT to address above impairments and improve overall function.  MODIFICATION OR ASSISTANCE TO COMPLETE EVALUATION: {OT modification:25474}  OT OCCUPATIONAL PROFILE AND HISTORY: {OT PROFILE AND HISTORY:25484}  CLINICAL DECISION MAKING: {OT CDM:25475}  REHAB POTENTIAL: {rehabpotential:25112}  EVALUATION COMPLEXITY: {Evaluation complexity:25115}    PLAN:  OT FREQUENCY: {rehab frequency:25116}  OT DURATION: {rehab duration:25117}  PLANNED INTERVENTIONS: {OT Interventions:25467}  RECOMMENDED OTHER SERVICES: ***  CONSULTED AND AGREED WITH PLAN OF CARE: {ENR:74513}  PLAN FOR NEXT SESSION: ***   Inocente MARLA Blazing, OT 06/01/2024, 2:07 PM

## 2024-06-02 ENCOUNTER — Ambulatory Visit: Admitting: Physician Assistant

## 2024-06-02 DIAGNOSIS — H9313 Tinnitus, bilateral: Secondary | ICD-10-CM

## 2024-06-02 DIAGNOSIS — R002 Palpitations: Secondary | ICD-10-CM

## 2024-06-02 DIAGNOSIS — G40909 Epilepsy, unspecified, not intractable, without status epilepticus: Secondary | ICD-10-CM

## 2024-06-02 DIAGNOSIS — G804 Ataxic cerebral palsy: Secondary | ICD-10-CM

## 2024-06-06 ENCOUNTER — Ambulatory Visit

## 2024-06-06 NOTE — Therapy (Incomplete)
 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 Patient Active Problem List   Diagnosis Date Noted   Gastroesophageal reflux disease without esophagitis 03/02/2024   Tinnitus of both ears 03/02/2024   Enlarged thyroid  gland 03/02/2024   Acute non-recurrent pansinusitis 08/02/2023   Seasonal allergic rhinitis 08/02/2023   Anxiety and depression 04/08/2023   Encounter for therapeutic drug monitoring 10/17/2018   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) 06/23/2018   Cerebral palsy (HCC) 06/23/2018   Seizure disorder (HCC) 01/09/2018    ONSET DATE: ~Gradually over the past 2 years  REFERRING DIAG: R29.898 (ICD-10-CM) - Leg weakness   THERAPY DIAG:   No diagnosis found.  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 Pt's mother noticed pt's strength and balance has declined gradually over the past 2 years. Patient's mother reports pt has R inattention/neglect. Pt's mother reports pt has R hand contracture  and requests pt receive OT to address this. Pt's mother reports previously pt could walk independently, although slow with a wide gait and excessive forward trunk flexed posture, but now he is unable to walk without constant L HHA. Pt's mother reports pt previously had a posterior walker, but he fell forward out of it. Mother reports pt also tried a RW and the same thing happened with pt having anterior LOB pushing AD too far out in front of himself (she states same thing happens if patient tries to walk with a buggy in the store). Pt's mother reports when pt navigating steps she provides B HHA rather than him using HRs; otherwise, he will bump up/down  the stairs on his bottom.Patient stating he wants to be able to walk without needing HHA from his mother. 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 No pain  PRECAUTIONS: Fall and Other: hx of seizures (chronic and stable)  WEIGHT BEARING RESTRICTIONS: No  FALLS: Has patient fallen in last 6 months? Yes. Number of falls mother reports pt has frequent falls when he gets up in the middle of the night to go to bathroom by himself ~1x every 2 weeks  LIVING ENVIRONMENT: Lives with: lives with their family and mother and father Lives in: House/apartment Stairs: Yes: Internal: 5 steps; bilateral but cannot reach both and External: 2 steps; using B HHA from pt's mother Has following equipment at home: shower chair CLOF: Patient scoots on his buttocks to navigate indoor stairs because the handrails are not close enough to reach. Patient using B HHA from his mother to navigate external stairs. Pt requires constant L HHA from his mother to ambulate (otherwise according to pt's mother, he is flexed forward so much that he is almost in quadruped). Mother reports pt can perform stand pivot transfers from EOB to gaming chair mod-I to wheel around in his room in  the chair.   PLOF: Independent with household mobility without device, Independent with gait, Independent with transfers, and Needs assistance with ADLs  PATIENT GOALS: walk independently without always needing HHA from his mother, get his R leg as strong his L leg  OBJECTIVE:  Note: Objective measures were completed at evaluation unless otherwise noted.                                                                                                                           TREATMENT DATE: 06/06/2024    ***  -overground AMB cx 10lb AW bilat, LUE HHA, cues for fast performance  -seated recovery -overground AMB cx 10lb AW bilat, LUE HHA, cues for fast performance (43m36s) -seated recovery  -overground funcitonal itme carry over 15' distance: 9lb AW, 15lb KB, 3kg ball, 1kg ball, each carried twice, ni device, ni Guard assist *blue mat obstacle on floor. Noted fatigue over course of activity with decline in speed and increase in stance width: take s1 standing recovery on final lap x30sec.    PATIENT EDUCATION: Education details: PT POC, findings on assessment, HEP; importance of multiplanar gait for safe access to community  Person educated: Patient and Mother Education method: Explanation and Handouts Education comprehension: verbalized understanding and needs further education  HOME EXERCISE PROGRAM: Access Code: [email or web address] URL: [email or web address] Date: 03/14/2024 Prepared by: Connell Kiss  Exercises - Step Taps with Counter Support  - 1 x daily - 7 x weekly - 2 sets - 10 reps  GOALS: Goals reviewed with patient? Yes  SHORT TERM GOALS: Target date: 04/25/2024  Patient will be independent with home exercise program to improve strength/mobility for increased functional independence with ADLs and  mobility.  Baseline: initiated on 7/22 and pt will require assistance from his mother 04/27/2024: need to progress Goal status: INITIAL  LONG TERM GOALS: Target date:  06/06/2024  Patient will increase Berg Balance score to > 51/56 to demonstrate improved balance and decreased fall risk during functional activities and ADLs.  Baseline: 03/30/2024: 17/56 04/27/2024: 21/56 Goal status: IN PROGRESS  2.  Patient will increase six minute walk test distance to >1065ft, using LRAD with no more than SBA, for progression to more independent community level ambulation, demonstrating improved gait endurance. Baseline: 03/30/2024: 653 feet (199 meters, Avg speed 0.55 m/s) using L HHA providing min A; 05/02/24: 335ft c trekking pole.  Goal status: IN PROGRESS  3.  Patient will increase 10 meter walk test to >1.17m/s using LRAD mod-I as to improve gait speed for better community ambulation and to reduce fall risk. Baseline: 0.20m/s using L HHA for min A  04/27/2024: Average Normal speed: 0.29 m/s using L walking stick with CGA for steadying & Average Fast speed: 0.36 m/s using L walking stick with light min A to facilitate weight shifting ---- VERSUS --- using L HHA providing min A Average Normal speed: 0.54 m/s & Average Fast speed: 0.61 m/s using Goal status: IN PROGRESS  4.  Patient will improve Stroke Impact Scale-16 by >9 points indicating patient experiencing increased independence with functional mobility tasks and ADLs to improve quality of life and decrease caregiver burden. (Questionnaire to be completed by his mother) Baseline: 39/80 04/27/2024: 39/80 Goal status: IN PROGRESS  5.  Patient will be modified independent with household level ambulation using LRAD to improve QOL, decrease fall risk, and promote overall increased independence in the home.  Baseline: currently requires constant L HHA to ambulate, providing min A 04/27/2024: gait with walking stick in L UE support at CGA level Goal status: IN PROGRESS  6.  Patient will be able to ascend/descend 4 steps using 1 HR support and no more than SBA to increase his independence with home entry.  Baseline: min A and B HR  or B HHA support  04/27/2024: L UE support on HR, carrying walking stick in R UE, and light min A (pt continues to prefer to bump up the stairs instead) Goal status: IN PROGRESS  ASSESSMENT:  CLINICAL IMPRESSION:    ***  Continued with overground AMB, high intensity gait training, cues to take greater strifdes and reduce stance width. Pt reports feeling generally good with this but reports some soreness previously afterward. Pt will continue to benefit from skilled therapy to address remaining deficits in order to improve overall QoL and return to PLOF.   OBJECTIVE IMPAIRMENTS: Abnormal gait, decreased activity tolerance, decreased balance, decreased cognition, decreased coordination, decreased endurance, decreased knowledge of use of DME, decreased mobility, difficulty walking, decreased strength, and impaired vision/preception.   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   REHAB POTENTIAL: Good  CLINICAL DECISION MAKING: Evolving/moderate complexity  EVALUATION COMPLEXITY: Moderate  PLAN:  PT FREQUENCY: 1-2x/week  PT DURATION: 12 weeks  PLANNED INTERVENTIONS: 97164- PT Re-evaluation, 97750- Physical Performance Testing, 97110-Therapeutic exercises, 97530- Therapeutic activity, [id number]- Neuromuscular re-education, 97535- Self Care, 02859- Manual therapy, 02883-  Gait training, 02239- Orthotic Initial, [id number]- Orthotic/Prosthetic subsequent, [phone]- Canalith repositioning, 02967-  Electrical stimulation (manual), Patient/Family education, Balance training, Stair training, Taping, Joint mobilization, Spinal mobilization, Vestibular training, Visual/preceptual remediation/compensation, Cognitive remediation, DME instructions, Cryotherapy, Moist heat, and Biofeedback   PLAN FOR NEXT SESSION:    Fonda Simpers, PT, DPT Physical Therapist - [facility]  06/06/24, 1:16 PM

## 2024-06-08 ENCOUNTER — Telehealth: Payer: Self-pay

## 2024-06-08 ENCOUNTER — Ambulatory Visit

## 2024-06-08 ENCOUNTER — Ambulatory Visit: Admitting: Occupational Therapy

## 2024-06-08 NOTE — Therapy (Incomplete)
 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 Patient Active Problem List   Diagnosis Date Noted   Gastroesophageal reflux disease without esophagitis 03/02/2024   Tinnitus of both ears 03/02/2024   Enlarged thyroid  gland 03/02/2024   Acute non-recurrent pansinusitis 08/02/2023   Seasonal allergic rhinitis 08/02/2023   Anxiety and depression 04/08/2023   Encounter for therapeutic drug monitoring 10/17/2018   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) 06/23/2018   Cerebral palsy (HCC) 06/23/2018   Seizure disorder (HCC) 01/09/2018    ONSET DATE: ~Gradually over the past 2 years  REFERRING DIAG: R29.898 (ICD-10-CM) - Leg weakness   THERAPY DIAG:   Muscle weakness (generalized)  Unsteadiness on feet  Generalized weakness  Difficulty in walking  Abnormality of gait  Rationale for Evaluation and Treatment: Rehabilitation  SUBJECTIVE:                                                                                                                                                                                             SUBJECTIVE STATEMENT:    ***   PERTINENT HISTORY: Patient's mother reports patient with 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 Pt's mother noticed pt's strength and balance has declined  gradually over the past 2 years. Patient's mother reports pt has R inattention/neglect. Pt's mother reports pt has R hand contracture and requests pt receive OT to address this. Pt's mother reports previously pt could walk independently, although slow with a wide gait and excessive forward trunk flexed posture, but now he is unable to walk without constant L HHA. Pt's mother reports pt previously had a posterior walker, but he fell forward out of it. Mother reports pt also tried a RW and the same thing happened with pt having anterior LOB pushing AD too far out in front of himself (she states same thing happens if patient tries to walk with a buggy in the store). Pt's mother  reports when pt navigating steps she provides B HHA rather than him using HRs; otherwise, he will bump up/down the stairs on his bottom.Patient stating he wants to be able to walk without needing HHA from his mother. 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 No pain  PRECAUTIONS: Fall and Other: hx of seizures (chronic and stable)  WEIGHT BEARING RESTRICTIONS: No  FALLS: Has patient fallen in last 6 months? Yes. Number of falls mother reports pt has frequent falls when he gets up in the middle of the night to go to bathroom by himself ~1x every 2 weeks  LIVING ENVIRONMENT: Lives with: lives with their family and mother and father Lives in: House/apartment Stairs: Yes: Internal: 5 steps; bilateral but cannot reach both and External: 2 steps; using B HHA from pt's mother Has following equipment at home: shower chair CLOF: Patient scoots on his buttocks to navigate indoor stairs because the handrails are not close enough to reach. Patient using B HHA from his mother to navigate external stairs. Pt requires constant L HHA from his mother to ambulate (otherwise according to pt's mother, he is flexed forward so much that he is  almost in quadruped). Mother reports pt can perform stand pivot transfers from EOB to gaming chair mod-I to wheel around in his room in the chair.   PLOF: Independent with household mobility without device, Independent with gait, Independent with transfers, and Needs assistance with ADLs  PATIENT GOALS: walk independently without always needing HHA from his mother, get his R leg as strong his L leg  OBJECTIVE:  Note: Objective measures were completed at evaluation unless otherwise noted.                                                                                                                           TREATMENT DATE: 06/08/2024    BERG    Ascend and descend steps     -overground AMB cx 10lb AW bilat, LUE HHA, cues for fast performance  -seated recovery -overground AMB cx 10lb AW bilat, LUE HHA, cues for fast performance (58m36s) -seated recovery  -overground funcitonal itme carry over 15' distance: 9lb AW, 15lb KB, 3kg ball, 1kg ball, each carried twice, ni device, ni Guard assist *blue mat obstacle on floor. Noted fatigue over course of activity with decline in speed and increase in stance width: take s1 standing recovery on final lap x30sec.    PATIENT EDUCATION: Education details: PT POC, findings on assessment, HEP; importance of multiplanar gait for safe access to community  Person educated: Patient and Mother Education method: Explanation and Handouts Education comprehension: verbalized understanding and needs further education  HOME EXERCISE PROGRAM: Access Code: [email or web address] URL: [email or web address] Date: 03/14/2024 Prepared by: Connell Kiss  Exercises - Step Taps with Counter Support  - 1 x daily - 7 x weekly - 2 sets - 10 reps  GOALS:  Goals reviewed with patient? Yes  SHORT TERM GOALS: Target date: 04/25/2024  Patient will be independent with home exercise program to improve strength/mobility for increased functional independence  with ADLs and mobility.  Baseline: initiated on 7/22 and pt will require assistance from his mother 04/27/2024: need to progress Goal status: INITIAL  LONG TERM GOALS: Target date: 06/06/2024  Patient will increase Berg Balance score to > 51/56 to demonstrate improved balance and decreased fall risk during functional activities and ADLs.  Baseline: 03/30/2024: 17/56 04/27/2024: 21/56 Goal status: IN PROGRESS  2.  Patient will increase six minute walk test distance to >1025ft, using LRAD with no more than SBA, for progression to more independent community level ambulation, demonstrating improved gait endurance. Baseline: 03/30/2024: 653 feet (199 meters, Avg speed 0.55 m/s) using L HHA providing min A; 05/02/24: 348ft c trekking pole.  Goal status: IN PROGRESS  3.  Patient will increase 10 meter walk test to >1.48m/s using LRAD mod-I as to improve gait speed for better community ambulation and to reduce fall risk. Baseline: 0.36m/s using L HHA for min A  04/27/2024: Average Normal speed: 0.29 m/s using L walking stick with CGA for steadying & Average Fast speed: 0.36 m/s using L walking stick with light min A to facilitate weight shifting ---- VERSUS --- using L HHA providing min A Average Normal speed: 0.54 m/s & Average Fast speed: 0.61 m/s using Goal status: IN PROGRESS  4.  Patient will improve Stroke Impact Scale-16 by >9 points indicating patient experiencing increased independence with functional mobility tasks and ADLs to improve quality of life and decrease caregiver burden. (Questionnaire to be completed by his mother) Baseline: 39/80 04/27/2024: 39/80 Goal status: IN PROGRESS  5.  Patient will be modified independent with household level ambulation using LRAD to improve QOL, decrease fall risk, and promote overall increased independence in the home.  Baseline: currently requires constant L HHA to ambulate, providing min A 04/27/2024: gait with walking stick in L UE support at CGA level Goal  status: IN PROGRESS  6.  Patient will be able to ascend/descend 4 steps using 1 HR support and no more than SBA to increase his independence with home entry.  Baseline: min A and B HR or B HHA support  04/27/2024: L UE support on HR, carrying walking stick in R UE, and light min A (pt continues to prefer to bump up the stairs instead) Goal status: IN PROGRESS  ASSESSMENT:  CLINICAL IMPRESSION:    ***  Continued with overground AMB, high intensity gait training, cues to take greater strifdes and reduce stance width. Pt reports feeling generally good with this but reports some soreness previously afterward. Pt will continue to benefit from skilled therapy to address remaining deficits in order to improve overall QoL and return to PLOF.   OBJECTIVE IMPAIRMENTS: Abnormal gait, decreased activity tolerance, decreased balance, decreased cognition, decreased coordination, decreased endurance, decreased knowledge of use of DME, decreased mobility, difficulty walking, decreased strength, and impaired vision/preception.   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   REHAB POTENTIAL: Good  CLINICAL DECISION MAKING: Evolving/moderate complexity  EVALUATION COMPLEXITY: Moderate  PLAN:  PT FREQUENCY:  1-2x/week  PT DURATION: 12 weeks  PLANNED INTERVENTIONS: 97164- PT Re-evaluation, 97750- Physical Performance Testing, 97110-Therapeutic exercises, 97530- Therapeutic activity, [id number]-  Neuromuscular re-education, 97535- Self Care, 02859- Manual therapy, [phone]- Gait training, [phone]- Orthotic Initial, [phone]- Orthotic/Prosthetic subsequent, [phone]- Canalith repositioning, [phone]- Electrical stimulation (manual), Patient/Family education, Balance training, Stair training, Taping, Joint mobilization, Spinal mobilization, Vestibular training, Visual/preceptual remediation/compensation, Cognitive remediation, DME instructions, Cryotherapy, Moist heat, and Biofeedback   PLAN FOR NEXT SESSION:   Note: Portions of this document were prepared using Dragon voice recognition software and although reviewed may contain unintentional dictation errors in syntax, grammar, or spelling.  Lonni KATHEE Gainer PT ,DPT Physical Therapist- [facility]   06/08/24, 7:18 AM

## 2024-06-08 NOTE — Telephone Encounter (Signed)
 Pt did not arrive for scheduled appointment. No telephone call nor message preceeded this absence. Author attempted to contact pt via telephone number listed in chart. Author left a HIPAA compliant VM for patient regarding absence. Also informed of DC from PT at this time. Certification with insurance ended on 06/06/24 and the plan was to DC at that time due to plateau in progress overall. Encouraged mom to call clinic with questions and to ask for a new referral should Doyal have a change in status that warrants another PT evaluation.   4:04 PM, 06/08/24 Peggye JAYSON Linear, PT, DPT Physical Therapist - [facility]  Outpatient Physical Therapy- Main Campus [phone]

## 2024-06-13 ENCOUNTER — Ambulatory Visit

## 2024-06-13 ENCOUNTER — Encounter: Admitting: Occupational Therapy

## 2024-06-15 ENCOUNTER — Ambulatory Visit: Admitting: Physical Therapy

## 2024-06-15 ENCOUNTER — Encounter: Admitting: Occupational Therapy

## 2024-06-20 ENCOUNTER — Ambulatory Visit

## 2024-06-20 ENCOUNTER — Encounter: Payer: Self-pay | Admitting: Physician Assistant

## 2024-06-20 ENCOUNTER — Ambulatory Visit: Admitting: Physician Assistant

## 2024-06-20 ENCOUNTER — Encounter: Admitting: Occupational Therapy

## 2024-06-20 VITALS — BP 118/82 | HR 85 | Temp 98.1°F | Ht 66.0 in | Wt 126.7 lb

## 2024-06-20 DIAGNOSIS — F419 Anxiety disorder, unspecified: Secondary | ICD-10-CM | POA: Diagnosis not present

## 2024-06-20 DIAGNOSIS — M25569 Pain in unspecified knee: Secondary | ICD-10-CM

## 2024-06-20 DIAGNOSIS — G40909 Epilepsy, unspecified, not intractable, without status epilepticus: Secondary | ICD-10-CM

## 2024-06-20 DIAGNOSIS — Z604 Social exclusion and rejection: Secondary | ICD-10-CM

## 2024-06-20 DIAGNOSIS — M79669 Pain in unspecified lower leg: Secondary | ICD-10-CM

## 2024-06-20 DIAGNOSIS — K219 Gastro-esophageal reflux disease without esophagitis: Secondary | ICD-10-CM

## 2024-06-20 DIAGNOSIS — G804 Ataxic cerebral palsy: Secondary | ICD-10-CM | POA: Diagnosis not present

## 2024-06-20 DIAGNOSIS — F32A Depression, unspecified: Secondary | ICD-10-CM

## 2024-06-20 NOTE — Progress Notes (Signed)
 Established patient visit  Patient: Tony Harper   DOB: March 16, 1996   28 y.o. Male  MRN: [id number] Visit Date: 06/20/2024  Today's healthcare provider: Jolynn Spencer, PA-C   Chief Complaint  Patient presents with   Acute Visit    Patient's dad is concerned that when he falls asleep his legs starts jerking and he tends to be breathing heavy so he isn't getting much sleep.  Concerned regarding him not getting much sleep.  Patient stated that the therapist suggested him being put on muscle relaxers but father is unsure.   Subjective     HPI     Acute Visit    Additional comments: Patient's dad is concerned that when he falls asleep his legs starts jerking and he tends to be breathing heavy so he isn't getting much sleep.  Concerned regarding him not getting much sleep.  Patient stated that the therapist suggested him being put on muscle relaxers but father is unsure.      Last edited by Terrel Powell LITTIE, CMA on 06/20/2024 11:01 AM.       Discussed the use of AI scribe software for clinical note transcription with the patient, who gave verbal consent to proceed.  History of Present Illness Tony Harper is a 28 year old male with cerebral palsy who presents with nocturnal leg jerking and heavy breathing. He is accompanied by his father.  He experiences increased frequency of leg jerking during sleep, which disrupts his sleep, along with episodes of heavy breathing. These symptoms have become more frequent. He takes carbamazepine  200 mg twice daily and 100 mg in the evening.  He occasionally experiences heavy breathing at night without regular snoring or gasping. He has knee pain, especially with movement, and leg pain that worsens when not seated. He sometimes experiences pain radiating down the leg.  He takes Pepcid  AC, one in the morning and one at night. He occasionally experiences discomfort during bowel movements without blood in the stool. He has a history of falls,  particularly when waking at night, and experiences pain in his kneecaps and legs.       03/02/2024   11:42 AM 04/06/2023    1:14 PM 12/28/2022    2:33 PM  Depression screen PHQ 2/9  Decreased Interest 0 3 0  Down, Depressed, Hopeless 0 3 0  PHQ - 2 Score 0 6 0  Altered sleeping 0 3   Tired, decreased energy 3 1   Change in appetite 1 3   Feeling bad or failure about yourself  0 3   Trouble concentrating 0 0   Moving slowly or fidgety/restless 0 0   Suicidal thoughts 0 1   PHQ-9 Score 4 17   Difficult doing work/chores Not difficult at all        03/02/2024   11:42 AM  GAD 7 : Generalized Anxiety Score  Nervous, Anxious, on Edge 0  Control/stop worrying 0  Worry too much - different things 0  Trouble relaxing 0  Restless 0  Easily annoyed or irritable 1  Afraid - awful might happen 0  Total GAD 7 Score 1  Anxiety Difficulty Not difficult at all    Medications: Outpatient Medications Prior to Visit  Medication Sig   carbamazepine  (CARBATROL ) 200 MG 12 hr capsule TAKE 1 CAPSULE BY MOUTH TWICE A DAY   phenytoin  (DILANTIN ) 100 MG ER capsule TAKE 2 CAPSULES BY MOUTH EVERY MORNING AND 1 CAPSULE EVERY NIGHT AT BEDTIME   [DISCONTINUED] benzonatate  (TESSALON )  200 MG capsule Take 1 capsule (200 mg total) by mouth 2 (two) times daily as needed for cough.   [DISCONTINUED] chlorpheniramine-HYDROcodone (TUSSIONEX) 10-8 MG/5ML Take 5 mLs by mouth at bedtime as needed for cough.   No facility-administered medications prior to visit.    Review of Systems All negative Except see HPI       Objective    BP 118/82 (BP Location: Left Arm, Patient Position: Sitting, Cuff Size: Normal)   Pulse 85   Temp 98.1 F (36.7 C) (Oral)   Ht 5' 6 (1.676 m)   Wt 126 lb 11.2 oz (57.5 kg)   SpO2 98%   BMI 20.45 kg/m     Physical Exam Vitals reviewed.  Constitutional:      General: He is not in acute distress.    Appearance: Normal appearance. He is not diaphoretic.  HENT:     Head:  Normocephalic and atraumatic.  Eyes:     General: No scleral icterus.    Conjunctiva/sclera: Conjunctivae normal.  Cardiovascular:     Rate and Rhythm: Normal rate and regular rhythm.     Pulses: Normal pulses.     Heart sounds: Normal heart sounds. No murmur heard. Pulmonary:     Effort: Pulmonary effort is normal. No respiratory distress.     Breath sounds: Normal breath sounds. No wheezing or rhonchi.  Musculoskeletal:     Cervical back: Neck supple.     Right lower leg: No edema.     Left lower leg: No edema.  Lymphadenopathy:     Cervical: No cervical adenopathy.  Skin:    General: Skin is warm and dry.     Findings: No rash.  Neurological:     Mental Status: He is alert and oriented to person, place, and time. Mental status is at baseline.  Psychiatric:        Mood and Affect: Mood normal.        Behavior: Behavior normal.      No results found for any visits on 06/20/24.      Assessment & Plan Cerebral palsy with muscle spasms and leg jerking Increased muscle spasms and leg jerking, especially at night, affecting sleep. Neurologist advised monitoring and contacting if symptoms worsen. Muscle relaxants suggested but deferred pending neurologist input. Consider gabapentin.  Defer to neurology - Contact [facility]/Celeste Delphos , [facility] for symptom discussion and medication review. - Continue carbamazepine  200 mg twice daily and 100 mg in the evening. - Reassess with neurology promptly.  Knee and leg pain Intermittent pain in knees and legs, no recent trauma. - Continue physical and occupational therapy. - Monitor pain, report changes or worsening.  Depression and anxiety/social isolation Therapy services now available in clinic, covered by insurance. Agreed to see therapist. - Schedule therapy sessions in clinic. - Consider psychiatric evaluation for medication adjustment if needed. Collaboration of Care: Medication Management AEB  , Primary Care  Provider AEB  , Psychiatrist AEB  , and Referral or follow-up with counselor/therapist AEB    Patient/Guardian was advised Release of Information must be obtained prior to any record release in order to collaborate their care with an outside provider. Patient/Guardian was advised if they have not already done so to contact the registration department to sign all necessary forms in order for us  to release information regarding their care.   Consent: Patient/Guardian gives verbal consent for treatment and assignment of benefits for services provided during this visit. Patient/Guardian expressed understanding and agreed to proceed.  Gastroesophageal reflux disease (GERD) Managing GERD with Pepcid  AC, morning and night doses. - Continue Pepcid  AC as prescribed.  General Health Maintenance Due for multiple vaccinations, emphasized importance of scheduling. - Schedule annual wellness exam. - Consider vaccinations for hepatitis B, COVID, HPV, tetanus, pneumonia, and flu.    No orders of the defined types were placed in this encounter.   No follow-ups on file.   The patient was advised to call back or seek an in-person evaluation if the symptoms worsen or if the condition fails to improve as anticipated.  I discussed the assessment and treatment plan with the patient. The patient was provided an opportunity to ask questions and all were answered. The patient agreed with the plan and demonstrated an understanding of the instructions.  I, Quintavius Niebuhr, PA-C have reviewed all documentation for this visit. The documentation on 06/20/2024  for the exam, diagnosis, procedures, and orders are all accurate and complete.  Jolynn Spencer, [facility], MMS [facility] [phone] (phone) [phone] (fax)  [facility]

## 2024-06-22 ENCOUNTER — Encounter

## 2024-06-22 ENCOUNTER — Ambulatory Visit: Admitting: Physical Therapy

## 2024-06-27 ENCOUNTER — Ambulatory Visit

## 2024-06-27 ENCOUNTER — Encounter: Admitting: Occupational Therapy

## 2024-06-29 ENCOUNTER — Encounter

## 2024-06-29 ENCOUNTER — Ambulatory Visit: Admitting: Physical Therapy

## 2024-07-04 ENCOUNTER — Ambulatory Visit

## 2024-07-04 ENCOUNTER — Encounter

## 2024-07-06 ENCOUNTER — Encounter

## 2024-07-06 ENCOUNTER — Ambulatory Visit: Admitting: Physical Therapy

## 2024-07-11 ENCOUNTER — Ambulatory Visit

## 2024-07-11 ENCOUNTER — Encounter: Admitting: Occupational Therapy

## 2024-07-13 ENCOUNTER — Ambulatory Visit

## 2024-07-13 ENCOUNTER — Encounter

## 2024-07-18 ENCOUNTER — Encounter: Admitting: Occupational Therapy

## 2024-07-18 ENCOUNTER — Ambulatory Visit

## 2024-07-25 ENCOUNTER — Ambulatory Visit

## 2024-07-25 ENCOUNTER — Encounter: Admitting: Occupational Therapy

## 2024-07-27 ENCOUNTER — Encounter

## 2024-07-27 ENCOUNTER — Ambulatory Visit: Admitting: Physical Therapy

## 2024-07-31 ENCOUNTER — Ambulatory Visit (INDEPENDENT_AMBULATORY_CARE_PROVIDER_SITE_OTHER): Admitting: Licensed Clinical Social Worker

## 2024-07-31 NOTE — BH Specialist Note (Signed)
 [facility] Collaborative Care Clinician attempted to connect with patient for scheduled appointment in office. Patient did not show up for the appointment. After waiting 10 minutes, clinician attempted to reach pt by phone to offer virtual visit.   A male adult answered the phone and stated that the patient was still sleeping.

## 2024-08-01 ENCOUNTER — Ambulatory Visit: Admitting: Physical Therapy

## 2024-08-01 ENCOUNTER — Encounter: Admitting: Occupational Therapy

## 2024-08-02 ENCOUNTER — Ambulatory Visit

## 2024-08-02 VITALS — Ht 66.0 in | Wt 120.0 lb

## 2024-08-02 DIAGNOSIS — Z0000 Encounter for general adult medical examination without abnormal findings: Secondary | ICD-10-CM | POA: Diagnosis not present

## 2024-08-02 NOTE — Progress Notes (Signed)
 Chief Complaint  Patient presents with   Medicare Wellness     Subjective:   Tony Harper is a 28 y.o. male who presents for a Medicare Annual Wellness Visit.  Visit info / Clinical Intake: Medicare Wellness Visit Type:: Initial Annual Wellness Visit Persons participating in visit and providing information:: patient & caregiver Medicare Wellness Visit Mode:: Telephone If telephone:: video declined Since this visit was completed virtually, some vitals may be partially provided or unavailable. Missing vitals are due to the limitations of the virtual format.: Documented vitals are patient reported If Telephone or Video please confirm:: I connected with patient using audio/video enable telemedicine. I verified patient identity with two identifiers, discussed telehealth limitations, and patient agreed to proceed. Patient Location:: home Provider Location:: home Interpreter Needed?: No Pre-visit prep was completed: yes AWV questionnaire completed by patient prior to visit?: no Living arrangements:: with family/others Patient's Overall Health Status Rating: very good Typical amount of pain: some Does pain affect daily life?: no Are you currently prescribed opioids?: no  Dietary Habits and Nutritional Risks How many meals a day?: 3 Eats fruit and vegetables daily?: (!) no Most meals are obtained by: having others provide food (parents prepare meals) In the last 2 weeks, have you had any of the following?: none Diabetic:: no  Functional Status Activities of Daily Living (to include ambulation/medication): (!) Needs Assist (parents help ADLs) Feeding: Independent Dressing/Grooming: Needs assistance Bathing: Needs assistance Toileting: Needs assistance Transfer: Needs assistance Ambulation: Independent with device- listed below Home Assistive Devices/Equipment: Cane; Shower/tub chair Medication Administration: Dependent (parents manage medications) Is this a change from  baseline?: Pre-admission baseline Home Management (perform basic housework or laundry): Dependent (parents manage home) Manage your own finances?: (!) no (parents manage finances) Primary transportation is: family / friends Concerns about vision?: no *vision screening is required for WTM* Concerns about hearing?: no  Fall Screening Falls in the past year?: 0 Number of falls in past year: 0 Was there an injury with Fall?: 0 Fall Risk Category Calculator: 0 Patient Fall Risk Level: Low Fall Risk  Fall Risk Patient at Risk for Falls Due to: Impaired balance/gait; Impaired mobility; Orthopedic patient Fall risk Follow up: Falls evaluation completed; Education provided; Falls prevention discussed  Home and Transportation Safety: All rugs have non-skid backing?: N/A, no rugs All stairs or steps have railings?: yes Grab bars in the bathtub or shower?: (!) no Have non-skid surface in bathtub or shower?: yes Good home lighting?: yes Regular seat belt use?: yes Hospital stays in the last year:: no  Cognitive Assessment Difficulty concentrating, remembering, or making decisions? : yes Will 6CIT or Mini Cog be Completed: yes What year is it?: 4 points What month is it?: 0 points Give patient an address phrase to remember (5 components): [address] [place] About what time is it?: 3 points Count backwards from 20 to 1: 4 points (could only count from 10 to 1) Say the months of the year in reverse: 4 points Repeat the address phrase from earlier: 10 points 6 CIT Score: 25 points  Advance Directives (For Healthcare) Does Patient Have a Medical Advance Directive?: No Would patient like information on creating a medical advance directive?: No - Patient declined  Reviewed/Updated  Reviewed/Updated: Reviewed All (Medical, Surgical, Family, Medications, Allergies, Care Teams, Patient Goals)    Allergies (verified) Guaifenesin and Tessalon  [benzonatate ]   Current Medications  (verified) Outpatient Encounter Medications as of 08/02/2024  Medication Sig   baclofen (LIORESAL) 10 MG tablet Take  5 mg by mouth 2 (two) times daily. For 120 days   carbamazepine  (CARBATROL ) 200 MG 12 hr capsule TAKE 1 CAPSULE BY MOUTH TWICE A DAY   famotidine  (PEPCID ) 10 MG tablet Take 10 mg by mouth 2 (two) times daily.   phenytoin  (DILANTIN ) 100 MG ER capsule TAKE 2 CAPSULES BY MOUTH EVERY MORNING AND 1 CAPSULE EVERY NIGHT AT BEDTIME   No facility-administered encounter medications on file as of 08/02/2024.    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 No pertinent surgical history. Family History  Problem Relation Age of Onset   Hypertension Mother    Asthma Mother    Healthy Father    Asthma Sister    Hypertension Maternal Aunt    Hypertension Maternal Grandmother    Stroke Maternal Grandmother    Cancer Maternal Grandfather    Stroke Paternal Grandmother    Social History   Occupational History   Not on file  Tobacco Use   Smoking status: Never   Smokeless tobacco: Never  Vaping Use   Vaping status: Never Used  Substance and Sexual Activity   Alcohol use: Yes    Comment: Rarely 1 beer   Drug use: Never   Sexual activity: Not on file   Tobacco Counseling Counseling given: Not Answered  SDOH Screenings   Food Insecurity: No Food Insecurity (08/02/2024)  Housing: Low Risk  (08/02/2024)  Transportation Needs: No Transportation Needs (08/02/2024)  Utilities: Not At Risk (08/02/2024)  Alcohol Screen: Low Risk  (12/28/2022)  Depression (PHQ2-9): Low Risk  (08/02/2024)   Physical Activity: Insufficiently Active (08/02/2024)  Social Connections: Socially Isolated (08/02/2024)  Stress: Stress Concern Present (08/02/2024)  Tobacco Use: Low Risk  (08/02/2024)  Health Literacy: Inadequate Health Literacy (08/02/2024)   See flowsheets for full screening details  Depression Screen PHQ 2 & 9 Depression Scale- Over the past 2 weeks, how often have you been bothered by any of the following problems? Little interest or pleasure in doing things: 0 Feeling down, depressed, or hopeless (PHQ Adolescent also includes...irritable): 3 (wants a girlfriend) PHQ-2 Total Score: 3 Trouble falling or staying asleep, or sleeping too much: 0 Feeling tired or having little energy: 0 Poor appetite or overeating (PHQ Adolescent also includes...weight loss): 0 Feeling bad about yourself - or that you are a failure or have let yourself or your family down: 0 Trouble concentrating on things, such as reading the newspaper or watching television (PHQ Adolescent also includes...like school work): 0 Moving or speaking so slowly that other people could have noticed. Or the opposite - being so fidgety or restless that you have been moving around a lot more than usual: 0 Thoughts that you would be better off dead, or of hurting yourself in some way: 0 PHQ-9 Total Score: 3 If you checked off any problems, how difficult have these problems made it for you to do your work, take care of things at home, or get along with other people?: Not difficult at all  Depression Treatment Depression Interventions/Treatment : EYV7-0 Score <4 Follow-up Not Indicated  Goals Addressed               This Visit's Progress     build up strength and muscles (pt-stated)               Objective:    Today's Vitals   08/02/24 9191  Weight: 120 lb (54.4 kg)  Height: 5' 6 (1.676 m)   Body mass index is 19.37 kg/m.  Hearing/Vision screen Hearing Screening - Comments:: Denies hearing  loss  Vision Screening - Comments:: Mother states no problems with vision. Has had eye exams in the past and was told by eye doctor that didn't need to have eye exams unless something changed. Immunizations and Health Maintenance Health Maintenance  Topic Date Due   Hepatitis B Vaccines 19-59 Average Risk (2 of 3 - 3-dose series) [birth year]27/1998   COVID-19 Vaccine (1) Never done   DTaP/Tdap/Td (6 - Tdap) 02/20/2009   HPV VACCINES (1 - Male 3-dose series) Never done   HIV Screening  Never done   Hepatitis C Screening  Never done   Pneumococcal Vaccine (1 of 2 - PCV) Never done   Influenza Vaccine  11/21/2024 (Originally 03/24/2024)   Medicare Annual Wellness (AWV)  08/02/2025   Meningococcal B Vaccine  Aged Out        Assessment/Plan:  This is a routine wellness examination for Meckling.  Patient Care Team: Ostwalt, Janna, PA-C as PCP - General (Physician Assistant) Cantwell, Celeste C, PA-C (Neurology) Wilburn Keller BROCKS, MD as Consulting Physician (Cardiology)  I have personally reviewed and noted the following in the patients chart:   Medical and social history Use of alcohol, tobacco or illicit drugs  Current medications and supplements including opioid prescriptions. Functional ability and status Nutritional status Physical activity Advanced directives List of other physicians Hospitalizations, surgeries, and ER visits in previous 12 months Vitals Screenings to include cognitive, depression, and falls Referrals and appointments  No orders of the defined types were placed in this encounter.  In addition, I have reviewed and discussed with patient certain preventive protocols, quality metrics, and best practice recommendations. A written personalized care plan for preventive services as well as general preventive health recommendations were provided to patient.   Vina Ned, CMA   08/02/2024   Return in 1 year (on 08/07/2025) for Medicare Annual Wellness Visit.  After  Visit Summary: (MyChart) Due to this being a telephonic visit, the after visit summary with patients personalized plan was offered to patient via MyChart   Nurse Notes:  Patient's mother, [name], assisted with today's visit 6 CIT Score - 25 Tdap UTD per mother Declined flu and covid vaccines

## 2024-08-02 NOTE — Patient Instructions (Addendum)
 Tony Harper,  Thank you for taking the time for your Medicare Wellness Visit. I appreciate your continued commitment to your health goals. Please review the care plan we discussed, and feel free to reach out if I can assist you further.  Please note that Annual Wellness Visits do not include a physical exam. Some assessments may be limited, especially if the visit was conducted virtually. If needed, we may recommend an in-person follow-up with your provider.  Ongoing Care Seeing your primary care provider every 3 to 6 months helps us  monitor your health and provide consistent, personalized care.   Referrals If a referral was made during today's visit and you haven't received any updates within two weeks, please contact the referred provider directly to check on the status.  Recommended Screenings: Keep up the good work!  Health Maintenance  Topic Date Due   Medicare Annual Wellness Visit  Never done   Hepatitis B Vaccine (2 of 3 - 3-dose series) 03/27/1998   COVID-19 Vaccine (1) Never done   DTaP/Tdap/Td vaccine (6 - Tdap) 02/20/2009   HPV Vaccine (1 - Male 3-dose series) Never done   HIV Screening  Never done   Hepatitis C Screening  Never done   Pneumococcal Vaccine (1 of 2 - PCV) Never done   Flu Shot  Never done   Meningitis B Vaccine  Aged Out       08/02/2024    8:14 AM  Advanced Directives  Does Patient Have a Medical Advance Directive? No  Would patient like information on creating a medical advance directive? No - Patient declined    Vision: Annual vision screenings are recommended for early detection of glaucoma, cataracts, and diabetic retinopathy. These exams can also reveal signs of chronic conditions such as diabetes and high blood pressure.  Dental: Annual dental screenings help detect early signs of oral cancer, gum disease, and other conditions linked to overall health, including heart disease and diabetes.  Please see the attached documents for additional  preventive care recommendations.

## 2024-08-03 ENCOUNTER — Ambulatory Visit: Admitting: Physical Therapy

## 2024-08-03 ENCOUNTER — Encounter

## 2024-08-08 ENCOUNTER — Ambulatory Visit: Admitting: Physical Therapy

## 2024-08-08 ENCOUNTER — Encounter: Admitting: Occupational Therapy

## 2024-08-10 ENCOUNTER — Encounter

## 2024-08-10 ENCOUNTER — Ambulatory Visit: Admitting: Physical Therapy

## 2024-08-15 ENCOUNTER — Encounter: Admitting: Occupational Therapy

## 2024-08-15 ENCOUNTER — Ambulatory Visit: Admitting: Physical Therapy

## 2024-08-22 ENCOUNTER — Ambulatory Visit: Admitting: Physical Therapy

## 2024-08-22 ENCOUNTER — Encounter: Admitting: Occupational Therapy

## 2024-08-29 ENCOUNTER — Ambulatory Visit: Admitting: Physical Therapy

## 2024-08-29 ENCOUNTER — Encounter: Admitting: Occupational Therapy

## 2024-08-31 ENCOUNTER — Encounter: Admitting: Occupational Therapy

## 2024-08-31 ENCOUNTER — Ambulatory Visit: Admitting: Physical Therapy

## 2024-09-05 ENCOUNTER — Encounter: Admitting: Occupational Therapy

## 2024-09-05 ENCOUNTER — Ambulatory Visit: Admitting: Physical Therapy

## 2024-09-07 ENCOUNTER — Ambulatory Visit: Admitting: Physical Therapy

## 2024-09-07 ENCOUNTER — Encounter: Admitting: Occupational Therapy

## 2024-09-12 ENCOUNTER — Ambulatory Visit: Admitting: Physical Therapy

## 2024-09-12 ENCOUNTER — Encounter: Admitting: Occupational Therapy

## 2024-09-14 ENCOUNTER — Ambulatory Visit: Admitting: Physical Therapy

## 2024-09-14 ENCOUNTER — Encounter: Admitting: Occupational Therapy

## 2024-09-18 NOTE — Progress Notes (Signed)
 " Established patient visit  Patient: Tony Harper   DOB: 1995/08/29   28 y.o. Male  MRN: [id number] Visit Date: 09/20/2024  Today's healthcare provider: Jolynn Spencer, PA-C   No chief complaint on file.  Subjective       Discussed the use of AI scribe software for clinical note transcription with the patient, who gave verbal consent to proceed.  History of Present Illness Tony Harper is a 29 year old male who presents with leg pain and jerking movements. He is accompanied by his mother.  He has had persistent leg pain and nighttime leg jerking for about a year, worse in the mornings and at night, and it significantly disrupts his sleep, including only about three hours of sleep last night. He has tried massage at home. He has been unable to attend physical therapy recently because of insurance issues and is waiting for Medicaid approval. He takes baclofen, carbamazepine , and Dilantin  twice daily.  He previously had chest pain that was evaluated with a three-day heart monitor without cardiac findings. Over-the-counter omeprazole gives partial relief, and chest pain returns if he misses a dose.  He reports worsening vision and needs to sit close to the TV to see clearly. He denies shortness of breath but notes episodes of heavy breathing.  He was told he had high blood sugar at a prior visit. 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  6.12 wnl     08/02/2024    8:27 AM 03/02/2024   11:42 AM 04/06/2023    1:14 PM  Depression screen PHQ 2/9  Decreased Interest 0 0 3  Down, Depressed, Hopeless 3 0 3  PHQ - 2 Score 3 0 6  Altered sleeping 0 0 3  Tired, decreased energy 0 3 1  Change in appetite 0 1 3  Feeling bad or failure about yourself  0 0 3  Trouble concentrating 0 0 0  Moving slowly or fidgety/restless 0 0 0  Suicidal thoughts 0 0 1  PHQ-9 Score 3 4  17    Difficult doing work/chores Not difficult at all Not difficult at all       Data saved with a previous flowsheet row definition      03/02/2024   11:42 AM  GAD 7 : Generalized Anxiety Score  Nervous, Anxious, on Edge 0   Control/stop worrying 0   Worry too much - different things 0   Trouble relaxing 0   Restless 0   Easily annoyed or irritable 1   Afraid - awful might happen 0   Total GAD 7 Score 1  Anxiety Difficulty Not difficult at all     Data saved with a previous flowsheet row definition    Medications: Show/hide medication list[1]  Review of Systems  All other systems reviewed and are negative.  All negative Except see HPI       Objective    There were no vitals taken for this visit.    Physical Exam Vitals reviewed.  Constitutional:      General: He is not in acute distress.    Appearance: Normal appearance. He is not diaphoretic.  HENT:     Head: Normocephalic and atraumatic.  Eyes:     General: No scleral icterus.    Conjunctiva/sclera: Conjunctivae normal.  Cardiovascular:     Rate and Rhythm: Normal rate and regular rhythm.     Pulses: Normal pulses.     Heart sounds: Normal heart sounds.  No murmur heard. Pulmonary:     Effort: Pulmonary effort is normal. No respiratory distress.     Breath sounds: Normal breath sounds. No wheezing or rhonchi.  Musculoskeletal:     Cervical back: Neck supple.     Right lower leg: No edema.     Left lower leg: No edema.  Lymphadenopathy:     Cervical: No cervical adenopathy.  Skin:    General: Skin is warm and dry.     Findings: No rash.  Neurological:     Mental Status: He is alert and oriented to person, place, and time. Mental status is at baseline.  Psychiatric:        Mood and Affect: Mood normal.        Behavior: Behavior normal.      No results found for any visits on 09/20/24.      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 Emphasized importance of physical therapy and neurology follow-up. -  Continue baclofen, carbamazepine , and Dilantin  as prescribed. - Follow up with neurology on February 7th. - Schedule physical therapy once approved. - Ensure physical therapy is in-network with insurance. Will follow-up  Restless legs syndrome Chronic condition with nocturnal symptoms. Neurology follow-up and adjunctive therapies recommended. - Follow up with neurology for potential medication adjustments. - Consider massage therapy for symptom relief. - Schedule physical therapy once approved. Will follow-up  Chronic knee and lower leg pain Chronic pain managed with neurology and orthopedics consultations. Physical therapy pending approval. - Schedule physical therapy once approved. - Follow up with neurology and orthopedics as needed.  Gastroesophageal reflux disease Chronic and unstable Intermittent chest pain likely GERD-related. Partial relief with current omeprazole dosage. - Increase omeprazole to two tablets daily, one in the morning and one in the evening. - Monitor symptoms and adjust treatment as needed.  Anxiety and depression Chronic and unstable Ongoing anxiety and depression. Counseling emphasized, psychiatry referral considered if needed. - Schedule appointment with counseling services. - Consider referral to psychiatry if counseling is insufficient.  General health maintenance Discussed vaccinations. He declined COVID, HPV, and tetanus vaccines. Hepatitis C vaccine considered due to asymptomatic nature. - Checked with neurologist regarding tetanus vaccine.   Pain of knee and lower leg, unspecified laterality -cbc  RLS (restless legs syndrome)  - CBC with Differential/Platelet - Basic metabolic panel with GFR  Need for hepatitis C screening test Low risk screening - Hepatitis C antibody   No orders of the defined types were placed in this encounter.   No follow-ups on file.   The patient was advised to call back or seek an in-person evaluation if  the symptoms worsen or if the condition fails to improve as anticipated.  I discussed the assessment and treatment plan with the patient. The patient was provided an opportunity to ask questions and all were answered. The patient agreed with the plan and demonstrated an understanding of the instructions.  I, Lasya Vetter, PA-C have reviewed all documentation for this visit. The documentation on 09/20/2024  for the exam, diagnosis, procedures, and orders are all accurate and complete.  Jolynn Spencer, [facility], MMS [facility] [phone] (phone) [phone] (fax)  [facility]     [1]  Outpatient Medications Prior to Visit  Medication Sig   baclofen (LIORESAL) 10 MG tablet Take 5 mg by mouth 2 (two) times daily. For 120 days   carbamazepine  (CARBATROL ) 200 MG 12 hr capsule TAKE 1 CAPSULE BY MOUTH TWICE  A DAY   famotidine  (PEPCID ) 10 MG tablet Take 10 mg by mouth 2 (two) times daily.   phenytoin  (DILANTIN ) 100 MG ER capsule TAKE 2 CAPSULES BY MOUTH EVERY MORNING AND 1 CAPSULE EVERY NIGHT AT BEDTIME   No facility-administered medications prior to visit.   "

## 2024-09-19 ENCOUNTER — Encounter: Admitting: Occupational Therapy

## 2024-09-19 ENCOUNTER — Ambulatory Visit: Admitting: Physical Therapy

## 2024-09-20 ENCOUNTER — Ambulatory Visit: Admitting: Physician Assistant

## 2024-09-20 ENCOUNTER — Encounter: Payer: Self-pay | Admitting: Physician Assistant

## 2024-09-20 VITALS — BP 126/89 | HR 66 | Wt 137.0 lb

## 2024-09-20 DIAGNOSIS — G804 Ataxic cerebral palsy: Secondary | ICD-10-CM | POA: Diagnosis not present

## 2024-09-20 DIAGNOSIS — M25569 Pain in unspecified knee: Secondary | ICD-10-CM

## 2024-09-20 DIAGNOSIS — Z1159 Encounter for screening for other viral diseases: Secondary | ICD-10-CM

## 2024-09-20 DIAGNOSIS — F419 Anxiety disorder, unspecified: Secondary | ICD-10-CM | POA: Diagnosis not present

## 2024-09-20 DIAGNOSIS — F32A Depression, unspecified: Secondary | ICD-10-CM | POA: Diagnosis not present

## 2024-09-20 DIAGNOSIS — G2581 Restless legs syndrome: Secondary | ICD-10-CM | POA: Diagnosis not present

## 2024-09-20 DIAGNOSIS — M79669 Pain in unspecified lower leg: Secondary | ICD-10-CM

## 2024-09-20 DIAGNOSIS — K219 Gastro-esophageal reflux disease without esophagitis: Secondary | ICD-10-CM

## 2024-09-21 ENCOUNTER — Ambulatory Visit: Admitting: Physical Therapy

## 2024-09-21 ENCOUNTER — Encounter: Admitting: Occupational Therapy

## 2024-09-21 LAB — CBC WITH DIFFERENTIAL/PLATELET
Basophils Absolute: 0.1 10*3/uL (ref 0.0–0.2)
Basos: 1 %
EOS (ABSOLUTE): 0.1 10*3/uL (ref 0.0–0.4)
Eos: 1 %
Hematocrit: 48.4 % (ref 37.5–51.0)
Hemoglobin: 16.3 g/dL (ref 13.0–17.7)
Immature Grans (Abs): 0 10*3/uL (ref 0.0–0.1)
Immature Granulocytes: 0 %
Lymphocytes Absolute: 2.3 10*3/uL (ref 0.7–3.1)
Lymphs: 34 %
MCH: 30 pg (ref 26.6–33.0)
MCHC: 33.7 g/dL (ref 31.5–35.7)
MCV: 89 fL (ref 79–97)
Monocytes Absolute: 0.5 10*3/uL (ref 0.1–0.9)
Monocytes: 8 %
Neutrophils Absolute: 3.6 10*3/uL (ref 1.4–7.0)
Neutrophils: 56 %
Platelets: 214 10*3/uL (ref 150–450)
RBC: 5.44 x10E6/uL (ref 4.14–5.80)
RDW: 12.9 % (ref 11.6–15.4)
WBC: 6.6 10*3/uL (ref 3.4–10.8)

## 2024-09-21 LAB — BASIC METABOLIC PANEL WITH GFR
BUN/Creatinine Ratio: 8 — ABNORMAL LOW (ref 9–20)
BUN: 8 mg/dL (ref 6–20)
CO2: 24 mmol/L (ref 20–29)
Calcium: 9.4 mg/dL (ref 8.7–10.2)
Chloride: 102 mmol/L (ref 96–106)
Creatinine, Ser: 1.01 mg/dL (ref 0.76–1.27)
Glucose: 91 mg/dL (ref 70–99)
Potassium: 4.3 mmol/L (ref 3.5–5.2)
Sodium: 140 mmol/L (ref 134–144)
eGFR: 105 mL/min/{1.73_m2}

## 2024-09-25 ENCOUNTER — Ambulatory Visit: Payer: Self-pay | Admitting: Physician Assistant

## 2024-09-26 ENCOUNTER — Encounter: Admitting: Occupational Therapy

## 2024-09-26 ENCOUNTER — Ambulatory Visit: Admitting: Physical Therapy

## 2024-09-28 ENCOUNTER — Encounter: Admitting: Occupational Therapy

## 2024-09-28 ENCOUNTER — Ambulatory Visit: Admitting: Physical Therapy

## 2024-10-03 ENCOUNTER — Ambulatory Visit: Admitting: Physical Therapy

## 2024-10-03 ENCOUNTER — Encounter: Admitting: Occupational Therapy

## 2024-10-05 ENCOUNTER — Encounter: Admitting: Occupational Therapy

## 2024-10-05 ENCOUNTER — Ambulatory Visit: Admitting: Physical Therapy

## 2024-10-10 ENCOUNTER — Encounter: Admitting: Occupational Therapy

## 2024-10-10 ENCOUNTER — Ambulatory Visit: Admitting: Physical Therapy

## 2024-10-12 ENCOUNTER — Ambulatory Visit: Admitting: Physical Therapy

## 2024-10-12 ENCOUNTER — Encounter: Admitting: Occupational Therapy

## 2024-10-17 ENCOUNTER — Encounter: Admitting: Occupational Therapy

## 2024-10-17 ENCOUNTER — Ambulatory Visit: Admitting: Physical Therapy

## 2024-10-19 ENCOUNTER — Ambulatory Visit: Admitting: Physical Therapy

## 2024-10-19 ENCOUNTER — Encounter: Admitting: Occupational Therapy

## 2024-10-24 ENCOUNTER — Ambulatory Visit: Admitting: Physical Therapy

## 2024-10-24 ENCOUNTER — Encounter: Admitting: Occupational Therapy

## 2024-10-26 ENCOUNTER — Ambulatory Visit: Admitting: Physical Therapy

## 2024-10-26 ENCOUNTER — Encounter: Admitting: Occupational Therapy

## 2024-10-31 ENCOUNTER — Ambulatory Visit: Admitting: Physical Therapy

## 2024-10-31 ENCOUNTER — Encounter: Admitting: Occupational Therapy

## 2024-11-02 ENCOUNTER — Encounter: Admitting: Occupational Therapy

## 2024-11-02 ENCOUNTER — Ambulatory Visit: Admitting: Physical Therapy

## 2025-01-18 ENCOUNTER — Ambulatory Visit: Admitting: Physician Assistant

## 2025-08-07 ENCOUNTER — Ambulatory Visit
